# Patient Record
Sex: Female | Born: 1945 | Race: White | Hispanic: No | Marital: Married | Smoking: Former smoker
Health system: Southern US, Community
[De-identification: ages and names within clinical notes are randomized; demographics above are authoritative.]

## PROBLEM LIST (undated history)

## (undated) DIAGNOSIS — B019 Varicella without complication: Secondary | ICD-10-CM

## (undated) DIAGNOSIS — M810 Age-related osteoporosis without current pathological fracture: Secondary | ICD-10-CM

## (undated) DIAGNOSIS — E079 Disorder of thyroid, unspecified: Secondary | ICD-10-CM

## (undated) DIAGNOSIS — M316 Other giant cell arteritis: Secondary | ICD-10-CM

## (undated) DIAGNOSIS — E785 Hyperlipidemia, unspecified: Secondary | ICD-10-CM

## (undated) DIAGNOSIS — K635 Polyp of colon: Secondary | ICD-10-CM

## (undated) HISTORY — PX: ANKLE SURGERY: SHX546

## (undated) HISTORY — PX: TONSILLECTOMY AND ADENOIDECTOMY: SUR1326

## (undated) HISTORY — DX: Other giant cell arteritis: M31.6

## (undated) HISTORY — DX: Polyp of colon: K63.5

## (undated) HISTORY — DX: Varicella without complication: B01.9

## (undated) HISTORY — PX: CATARACT EXTRACTION: SUR2

## (undated) HISTORY — PX: TUBAL LIGATION: SHX77

## (undated) HISTORY — DX: Hyperlipidemia, unspecified: E78.5

## (undated) HISTORY — DX: Disorder of thyroid, unspecified: E07.9

## (undated) HISTORY — DX: Age-related osteoporosis without current pathological fracture: M81.0

---

## 2013-02-11 DIAGNOSIS — H269 Unspecified cataract: Secondary | ICD-10-CM | POA: Insufficient documentation

## 2015-10-22 DIAGNOSIS — R1313 Dysphagia, pharyngeal phase: Secondary | ICD-10-CM | POA: Insufficient documentation

## 2016-01-18 DIAGNOSIS — K635 Polyp of colon: Secondary | ICD-10-CM | POA: Insufficient documentation

## 2016-01-18 DIAGNOSIS — Z83719 Family history of colon polyps, unspecified: Secondary | ICD-10-CM | POA: Insufficient documentation

## 2016-01-18 DIAGNOSIS — Z8371 Family history of colonic polyps: Secondary | ICD-10-CM | POA: Insufficient documentation

## 2016-02-23 DIAGNOSIS — Z8739 Personal history of other diseases of the musculoskeletal system and connective tissue: Secondary | ICD-10-CM | POA: Insufficient documentation

## 2016-12-08 DIAGNOSIS — M4125 Other idiopathic scoliosis, thoracolumbar region: Secondary | ICD-10-CM | POA: Insufficient documentation

## 2018-02-02 DIAGNOSIS — Z636 Dependent relative needing care at home: Secondary | ICD-10-CM | POA: Insufficient documentation

## 2019-05-23 ENCOUNTER — Telehealth: Payer: Self-pay | Admitting: General Practice

## 2019-05-23 NOTE — Telephone Encounter (Signed)
Copied from [place] [id number]. Topic: Appointment Scheduling - Scheduling Inquiry for Clinic >> May 23, 2019 12:53 PM Rutherford Nail, [place] wrote: Reason for CRM: Patient calling and states that she was referred by Stanton Kidney and Pete Glatter to see Dr Alain Marion. States that they had spoken with Dr Alain Marion and he verbally agreed to see patient and her husband as new patients. Please advise. CB#: [phone]  Dr.Plotnikov please advise, Thank you.

## 2019-05-24 NOTE — Telephone Encounter (Signed)
Ok Thx 

## 2019-05-24 NOTE — Telephone Encounter (Signed)
Appointment has been made for 12/15.  °

## 2019-06-08 ENCOUNTER — Encounter: Payer: Self-pay | Admitting: Internal Medicine

## 2019-06-21 ENCOUNTER — Encounter: Payer: Self-pay | Admitting: Internal Medicine

## 2019-06-21 ENCOUNTER — Encounter (INDEPENDENT_AMBULATORY_CARE_PROVIDER_SITE_OTHER): Payer: Self-pay

## 2019-06-21 ENCOUNTER — Other Ambulatory Visit (INDEPENDENT_AMBULATORY_CARE_PROVIDER_SITE_OTHER): Payer: Medicare Other

## 2019-06-21 ENCOUNTER — Ambulatory Visit (INDEPENDENT_AMBULATORY_CARE_PROVIDER_SITE_OTHER): Payer: Medicare Other | Admitting: Internal Medicine

## 2019-06-21 ENCOUNTER — Other Ambulatory Visit: Payer: Self-pay

## 2019-06-21 VITALS — BP 130/68 | HR 77 | Temp 97.9°F | Ht 62.0 in | Wt 100.0 lb

## 2019-06-21 DIAGNOSIS — E785 Hyperlipidemia, unspecified: Secondary | ICD-10-CM

## 2019-06-21 DIAGNOSIS — M316 Other giant cell arteritis: Secondary | ICD-10-CM

## 2019-06-21 DIAGNOSIS — M810 Age-related osteoporosis without current pathological fracture: Secondary | ICD-10-CM | POA: Insufficient documentation

## 2019-06-21 DIAGNOSIS — M47816 Spondylosis without myelopathy or radiculopathy, lumbar region: Secondary | ICD-10-CM

## 2019-06-21 DIAGNOSIS — M4126 Other idiopathic scoliosis, lumbar region: Secondary | ICD-10-CM

## 2019-06-21 DIAGNOSIS — M818 Other osteoporosis without current pathological fracture: Secondary | ICD-10-CM | POA: Diagnosis not present

## 2019-06-21 DIAGNOSIS — M1990 Unspecified osteoarthritis, unspecified site: Secondary | ICD-10-CM | POA: Insufficient documentation

## 2019-06-21 DIAGNOSIS — M419 Scoliosis, unspecified: Secondary | ICD-10-CM | POA: Insufficient documentation

## 2019-06-21 DIAGNOSIS — E559 Vitamin D deficiency, unspecified: Secondary | ICD-10-CM | POA: Diagnosis not present

## 2019-06-21 LAB — LIPID PANEL
Cholesterol: 138 mg/dL (ref 0–200)
HDL: 50.7 mg/dL (ref >39.00)
LDL Cholesterol: 74 mg/dL (ref 0–99)
NonHDL: 87.62
Total CHOL/HDL Ratio: 3
Triglycerides: 68 mg/dL (ref 0.0–149.0)
VLDL: 13.6 mg/dL (ref 0.0–40.0)

## 2019-06-21 LAB — CBC WITH DIFFERENTIAL/PLATELET
Basophils Absolute: 0 10*3/uL (ref 0.0–0.1)
Basophils Relative: 0.5 % (ref 0.0–3.0)
Eosinophils Absolute: 0.1 10*3/uL (ref 0.0–0.7)
Eosinophils Relative: 1.5 % (ref 0.0–5.0)
HCT: 35.4 % — ABNORMAL LOW (ref 36.0–46.0)
Hemoglobin: 12 g/dL (ref 12.0–15.0)
Lymphocytes Relative: 18.5 % (ref 12.0–46.0)
Lymphs Abs: 1.4 10*3/uL (ref 0.7–4.0)
MCHC: 33.8 g/dL (ref 30.0–36.0)
MCV: 89.1 fl (ref 78.0–100.0)
Monocytes Absolute: 0.8 10*3/uL (ref 0.1–1.0)
Monocytes Relative: 10.1 % (ref 3.0–12.0)
Neutro Abs: 5.2 10*3/uL (ref 1.4–7.7)
Neutrophils Relative %: 69.4 % (ref 43.0–77.0)
Platelets: 260 10*3/uL (ref 150.0–400.0)
RBC: 3.98 Mil/uL (ref 3.87–5.11)
RDW: 13.2 % (ref 11.5–15.5)
WBC: 7.5 10*3/uL (ref 4.0–10.5)

## 2019-06-21 LAB — HEPATIC FUNCTION PANEL
ALT: 20 U/L (ref 0–35)
AST: 27 U/L (ref 0–37)
Albumin: 4 g/dL (ref 3.5–5.2)
Alkaline Phosphatase: 57 U/L (ref 39–117)
Bilirubin, Direct: 0.1 mg/dL (ref 0.0–0.3)
Total Bilirubin: 0.3 mg/dL (ref 0.2–1.2)
Total Protein: 7.1 g/dL (ref 6.0–8.3)

## 2019-06-21 LAB — BASIC METABOLIC PANEL
BUN: 15 mg/dL (ref 6–23)
CO2: 30 mEq/L (ref 19–32)
Calcium: 9.3 mg/dL (ref 8.4–10.5)
Chloride: 101 mEq/L (ref 96–112)
Creatinine, Ser: 0.64 mg/dL (ref 0.40–1.20)
GFR: 90.69 mL/min (ref >60.00)
Glucose, Bld: 118 mg/dL — ABNORMAL HIGH (ref 70–99)
Potassium: 3.7 mEq/L (ref 3.5–5.1)
Sodium: 138 mEq/L (ref 135–145)

## 2019-06-21 LAB — C-REACTIVE PROTEIN: CRP: 3.6 mg/dL (ref 0.5–20.0)

## 2019-06-21 LAB — TSH: TSH: 2.22 u[IU]/mL (ref 0.35–4.50)

## 2019-06-21 LAB — SEDIMENTATION RATE: Sed Rate: 32 mm/hr — ABNORMAL HIGH (ref 0–30)

## 2019-06-21 MED ORDER — SHINGRIX 50 MCG/0.5ML IM SUSR: 0.5000 mL | Freq: Once | INTRAMUSCULAR | 1 refills | Status: AC

## 2019-06-21 NOTE — Assessment & Plan Note (Signed)
On Crestor 

## 2019-06-21 NOTE — Progress Notes (Signed)
Subjective:  Patient ID: Julie Richard, female    DOB: 1945/12/22  Age: 73 y.o. MRN: [id number]  CC: No chief complaint on file.   HPI Julie Richard presents for a new pt visit H/o PMR 2017 C/o osteoporosis - on Prolia - Pt was on Actonel>10 y, then reclast x 5 years Started Prolia 2020, due another shot 3/21 On Vit D 400 iu 3/wk From [facility]; 2 sons here   No outpatient medications prior to visit.   No facility-administered medications prior to visit.    ROS: Review of Systems  Constitutional: Negative for activity change, appetite change, chills, fatigue and unexpected weight change.  HENT: Negative for congestion, mouth sores and sinus pressure.   Eyes: Negative for visual disturbance.  Respiratory: Negative for cough and chest tightness.   Gastrointestinal: Negative for abdominal pain and nausea.  Genitourinary: Negative for difficulty urinating, frequency and vaginal pain.  Musculoskeletal: Negative for back pain and gait problem.  Skin: Negative for pallor and rash.  Neurological: Negative for dizziness, tremors, weakness, numbness and headaches.  Psychiatric/Behavioral: Negative for confusion, sleep disturbance and suicidal ideas.    Objective:  BP 130/68 (BP Location: Left Arm, Patient Position: Sitting, Cuff Size: Normal)   Pulse 77   Temp 97.9 F (36.6 C) (Oral)   Ht 5\' 2"  (1.575 m)   Wt 100 lb (45.4 kg)   SpO2 98%   BMI 18.29 kg/m   BP Readings from Last 3 Encounters:  06/21/19 130/68    Wt Readings from Last 3 Encounters:  06/21/19 100 lb (45.4 kg)    Physical Exam Constitutional:      General: She is not in acute distress.    Appearance: She is well-developed.  HENT:     Head: Normocephalic.     Right Ear: External ear normal.     Left Ear: External ear normal.     Nose: Nose normal.  Eyes:     General:        Right eye: No discharge.        Left eye: No discharge.     Conjunctiva/sclera: Conjunctivae normal.     Pupils: Pupils are equal,  round, and reactive to light.  Neck:     Thyroid: No thyromegaly.     Vascular: No JVD.     Trachea: No tracheal deviation.  Cardiovascular:     Rate and Rhythm: Normal rate and regular rhythm.     Heart sounds: Normal heart sounds.  Pulmonary:     Effort: No respiratory distress.     Breath sounds: No stridor. No wheezing.  Abdominal:     General: Bowel sounds are normal. There is no distension.     Palpations: Abdomen is soft. There is no mass.     Tenderness: There is no abdominal tenderness. There is no guarding or rebound.  Musculoskeletal:        General: No tenderness.     Cervical back: Normal range of motion and neck supple.  Lymphadenopathy:     Cervical: No cervical adenopathy.  Skin:    Findings: No erythema or rash.  Neurological:     Cranial Nerves: No cranial nerve deficit.     Motor: No abnormal muscle tone.     Coordination: Coordination normal.     Deep Tendon Reflexes: Reflexes normal.  Psychiatric:        Behavior: Behavior normal.        Thought Content: Thought content normal.  Judgment: Judgment normal.   thin  No results found for: WBC, HGB, HCT, PLT, GLUCOSE, CHOL, TRIG, HDL, LDLDIRECT, LDLCALC, ALT, AST, NA, K, CL, CREATININE, BUN, CO2, TSH, PSA, INR, GLUF, HGBA1C, MICROALBUR  Patient was never admitted.  Assessment & Plan:   There are no diagnoses linked to this encounter.   No orders of the defined types were placed in this encounter.    Follow-up: No follow-ups on file.  Sonda Primes, MD

## 2019-06-21 NOTE — Assessment & Plan Note (Signed)
Meloxicam prn 

## 2019-06-21 NOTE — Assessment & Plan Note (Signed)
2017 R>L On steroids x 1 year

## 2019-06-21 NOTE — Assessment & Plan Note (Signed)
On Prolia 2020, due another shot 3/21 On Vit D 400 iu 3/wk labs

## 2019-06-22 LAB — VITAMIN D 25 HYDROXY (VIT D DEFICIENCY, FRACTURES): VITD: 72.05 ng/mL (ref 30.00–100.00)

## 2019-07-31 ENCOUNTER — Ambulatory Visit: Payer: Medicare Other | Attending: Internal Medicine

## 2019-07-31 DIAGNOSIS — Z23 Encounter for immunization: Secondary | ICD-10-CM | POA: Insufficient documentation

## 2019-07-31 NOTE — Progress Notes (Signed)
   Covid-19 Vaccination Clinic  Name:  Julie Richard    MRN: [id number] DOB: December 27, 1945  07/31/2019  Julie Richard was observed post Covid-19 immunization for 15 minutes without incidence. She was provided with Vaccine Information Sheet and instruction to access the V-Safe system.   Julie Richard was instructed to call 911 with any severe reactions post vaccine: Marland Kitchen Difficulty breathing  . Swelling of your face and throat  . A fast heartbeat  . A bad rash all over your body  . Dizziness and weakness    Immunizations Administered    Name Date Dose VIS Date Route   Pfizer COVID-19 Vaccine 07/31/2019 11:21 AM 0.3 mL 06/17/2019 Intramuscular   Manufacturer: [employer], [employer]   Lot: [id number]   NDC: [id number]

## 2019-08-02 ENCOUNTER — Telehealth: Payer: Self-pay | Admitting: Internal Medicine

## 2019-08-02 NOTE — Telephone Encounter (Signed)
Patient called stating that she and her husband both had their full 2 immunizations for COVID-19.  She is concerned because they both took regular strength Tylenol pre ans post the immunization.  She has read an article in [employer] stating that this could cut the antibody response and make the vaccine ineffective.w Patient is requesting call back for advice.

## 2019-08-03 NOTE — Telephone Encounter (Signed)
I would worry about it.  They should have a good response to the vaccine.  There is no official recommendation not to take Tylenol prior to vaccination. Thanks, 

## 2019-08-03 NOTE — Telephone Encounter (Signed)
Please advise 

## 2019-08-04 NOTE — Telephone Encounter (Signed)
Pt.notified

## 2019-08-20 ENCOUNTER — Ambulatory Visit: Payer: Medicare Other

## 2019-10-05 ENCOUNTER — Ambulatory Visit (INDEPENDENT_AMBULATORY_CARE_PROVIDER_SITE_OTHER): Payer: Medicare Other | Admitting: *Deleted

## 2019-10-05 ENCOUNTER — Other Ambulatory Visit: Payer: Self-pay

## 2019-10-05 DIAGNOSIS — M810 Age-related osteoporosis without current pathological fracture: Secondary | ICD-10-CM

## 2019-10-05 MED ORDER — DENOSUMAB 60 MG/ML ~~LOC~~ SOSY
60.0000 mg | PREFILLED_SYRINGE | Freq: Once | SUBCUTANEOUS | Status: AC
  Administered 2019-10-05: 60 mg via SUBCUTANEOUS

## 2019-10-05 NOTE — Progress Notes (Addendum)
Pls cosign for prolia inj../lmb  Medical screening examination/treatment/procedure(s) were performed by non-physician practitioner and as supervising physician I was immediately available for consultation/collaboration.  I agree with above. Aleksei Plotnikov, MD 

## 2019-10-07 ENCOUNTER — Ambulatory Visit: Payer: Medicare Other

## 2019-10-18 ENCOUNTER — Encounter: Payer: Self-pay | Admitting: Internal Medicine

## 2019-10-18 ENCOUNTER — Ambulatory Visit (INDEPENDENT_AMBULATORY_CARE_PROVIDER_SITE_OTHER): Payer: Medicare Other | Admitting: Internal Medicine

## 2019-10-18 ENCOUNTER — Other Ambulatory Visit: Payer: Self-pay

## 2019-10-18 DIAGNOSIS — M818 Other osteoporosis without current pathological fracture: Secondary | ICD-10-CM | POA: Diagnosis not present

## 2019-10-18 DIAGNOSIS — M47816 Spondylosis without myelopathy or radiculopathy, lumbar region: Secondary | ICD-10-CM

## 2019-10-18 DIAGNOSIS — E785 Hyperlipidemia, unspecified: Secondary | ICD-10-CM | POA: Diagnosis not present

## 2019-10-18 DIAGNOSIS — M316 Other giant cell arteritis: Secondary | ICD-10-CM

## 2019-10-18 MED ORDER — CHOLECALCIFEROL 50 MCG (2000 UT) PO TABS: ORAL_TABLET | ORAL | 3 refills | Status: AC

## 2019-10-18 NOTE — Assessment & Plan Note (Signed)
-   Crestor 

## 2019-10-18 NOTE — Patient Instructions (Signed)

## 2019-10-18 NOTE — Assessment & Plan Note (Signed)
Meloxicam prn 

## 2019-10-18 NOTE — Assessment & Plan Note (Signed)
Vit D3 1000 iu 3/week - high levels in the past

## 2019-10-18 NOTE — Progress Notes (Signed)
Subjective:  Patient ID: Julie Richard, female    DOB: [birth year]  Age: 74 y.o. MRN: [id number]  CC: No chief complaint on file.   HPI Julie Richard presents for LBP, osteoporosis, dyslipidemia f/u  Outpatient Medications Prior to Visit  Medication Sig Dispense Refill  . baclofen (LIORESAL) 10 MG tablet Take by mouth.    Marland Kitchen CALCIUM PO Take by mouth.    . Cholecalciferol 1.25 MG (50000 UT) TABS Take by mouth.    . Cholecalciferol 50 MCG (2000 UT) TABS Take by mouth.    . denosumab (PROLIA) 60 MG/ML SOSY injection Inject 60 mg into the skin every 6 (six) months.    . meloxicam (MOBIC) 7.5 MG tablet TAKE 1 TABLET BY MOUTH AS NEEDED FOR PAIN    . Multiple Vitamin (MULTI-VITAMIN) tablet Take by mouth.    . rosuvastatin (CRESTOR) 10 MG tablet TAKE 1 TABLET(10 MG) BY MOUTH DAILY    . ascorbic acid (VITAMIN C) 500 MG tablet Take by mouth.     No facility-administered medications prior to visit.    ROS: Review of Systems  Constitutional: Negative for activity change, appetite change, chills, fatigue and unexpected weight change.  HENT: Negative for congestion, mouth sores and sinus pressure.   Eyes: Negative for visual disturbance.  Respiratory: Negative for cough and chest tightness.   Gastrointestinal: Negative for abdominal pain and nausea.  Genitourinary: Negative for difficulty urinating, frequency and vaginal pain.  Musculoskeletal: Positive for arthralgias and back pain. Negative for gait problem.  Skin: Negative for pallor and rash.  Neurological: Negative for dizziness, tremors, weakness, numbness and headaches.  Psychiatric/Behavioral: Negative for confusion and sleep disturbance.    Objective:  BP 118/60 (BP Location: Left Arm, Patient Position: Sitting, Cuff Size: Normal)   Pulse 68   Temp 97.8 F (36.6 C) (Oral)   Ht 5\' 2"  (1.575 m)   Wt 100 lb (45.4 kg)   SpO2 99%   BMI 18.29 kg/m   BP Readings from Last 3 Encounters:  10/18/19 118/60  06/21/19 130/68    Wt  Readings from Last 3 Encounters:  10/18/19 100 lb (45.4 kg)  06/21/19 100 lb (45.4 kg)    Physical Exam Constitutional:      General: She is not in acute distress.    Appearance: She is well-developed.  HENT:     Head: Normocephalic.     Right Ear: External ear normal.     Left Ear: External ear normal.     Nose: Nose normal.  Eyes:     General:        Right eye: No discharge.        Left eye: No discharge.     Conjunctiva/sclera: Conjunctivae normal.     Pupils: Pupils are equal, round, and reactive to light.  Neck:     Thyroid: No thyromegaly.     Vascular: No JVD.     Trachea: No tracheal deviation.  Cardiovascular:     Rate and Rhythm: Normal rate and regular rhythm.     Heart sounds: Normal heart sounds.  Pulmonary:     Effort: No respiratory distress.     Breath sounds: No stridor. No wheezing.  Abdominal:     General: Bowel sounds are normal. There is no distension.     Palpations: Abdomen is soft. There is no mass.     Tenderness: There is no abdominal tenderness. There is no guarding or rebound.  Musculoskeletal:        General: No  tenderness.     Cervical back: Normal range of motion and neck supple.  Lymphadenopathy:     Cervical: No cervical adenopathy.  Skin:    Findings: No erythema or rash.  Neurological:     Mental Status: She is oriented to person, place, and time.     Cranial Nerves: No cranial nerve deficit.     Motor: No abnormal muscle tone.     Coordination: Coordination normal.     Deep Tendon Reflexes: Reflexes normal.  Psychiatric:        Behavior: Behavior normal.        Thought Content: Thought content normal.        Judgment: Judgment normal.     Lab Results  Component Value Date   WBC 7.5 06/21/2019   HGB 12.0 06/21/2019   HCT 35.4 (L) 06/21/2019   PLT 260.0 06/21/2019   GLUCOSE 118 (H) 06/21/2019   CHOL 138 06/21/2019   TRIG 68.0 06/21/2019   HDL 50.70 06/21/2019   LDLCALC 74 06/21/2019   ALT 20 06/21/2019   AST 27  06/21/2019   NA 138 06/21/2019   K 3.7 06/21/2019   CL 101 06/21/2019   CREATININE 0.64 06/21/2019   BUN 15 06/21/2019   CO2 30 06/21/2019   TSH 2.22 06/21/2019    Patient was never admitted.  Assessment & Plan:   There are no diagnoses linked to this encounter.   No orders of the defined types were placed in this encounter.    Follow-up: No follow-ups on file.  Walker Kehr, MD

## 2019-10-21 DIAGNOSIS — H40002 Preglaucoma, unspecified, left eye: Secondary | ICD-10-CM | POA: Insufficient documentation

## 2019-10-21 DIAGNOSIS — H35363 Drusen (degenerative) of macula, bilateral: Secondary | ICD-10-CM | POA: Insufficient documentation

## 2019-11-08 ENCOUNTER — Encounter: Payer: Self-pay | Admitting: Internal Medicine

## 2019-11-29 ENCOUNTER — Ambulatory Visit (INDEPENDENT_AMBULATORY_CARE_PROVIDER_SITE_OTHER)
Admission: RE | Admit: 2019-11-29 | Discharge: 2019-11-29 | Disposition: A | Discharge status: Home / Self Care | Payer: Self-pay | Source: Ambulatory Visit | Attending: Internal Medicine | Admitting: Internal Medicine

## 2019-11-29 ENCOUNTER — Other Ambulatory Visit: Payer: Self-pay

## 2019-11-29 DIAGNOSIS — E785 Hyperlipidemia, unspecified: Secondary | ICD-10-CM

## 2019-12-07 ENCOUNTER — Other Ambulatory Visit: Payer: Self-pay | Admitting: Internal Medicine

## 2019-12-07 MED ORDER — ASPIRIN EC 81 MG PO TBEC: 81.0000 mg | DELAYED_RELEASE_TABLET | Freq: Every day | ORAL | 3 refills | Status: AC

## 2019-12-12 ENCOUNTER — Telehealth: Payer: Self-pay

## 2019-12-12 DIAGNOSIS — Z1231 Encounter for screening mammogram for malignant neoplasm of breast: Secondary | ICD-10-CM

## 2019-12-12 NOTE — Telephone Encounter (Signed)
Patient calling and would like to get a mammogram scheduled; no orders at this time. Please advise.

## 2019-12-13 NOTE — Telephone Encounter (Signed)
Okay.  Done. 

## 2019-12-27 ENCOUNTER — Other Ambulatory Visit: Payer: Self-pay | Admitting: Internal Medicine

## 2019-12-27 DIAGNOSIS — Z1231 Encounter for screening mammogram for malignant neoplasm of breast: Secondary | ICD-10-CM

## 2020-01-21 ENCOUNTER — Other Ambulatory Visit: Payer: Self-pay | Admitting: Internal Medicine

## 2020-01-23 ENCOUNTER — Other Ambulatory Visit: Payer: Self-pay

## 2020-01-23 ENCOUNTER — Ambulatory Visit
Admission: RE | Admit: 2020-01-23 | Discharge: 2020-01-23 | Disposition: A | Discharge status: Home / Self Care | Payer: Medicare Other | Source: Ambulatory Visit | Attending: Internal Medicine | Admitting: Internal Medicine

## 2020-02-23 ENCOUNTER — Other Ambulatory Visit: Payer: Self-pay

## 2020-02-23 ENCOUNTER — Encounter: Payer: Self-pay | Admitting: Internal Medicine

## 2020-02-23 ENCOUNTER — Ambulatory Visit (INDEPENDENT_AMBULATORY_CARE_PROVIDER_SITE_OTHER): Payer: Medicare Other | Admitting: Internal Medicine

## 2020-02-23 DIAGNOSIS — Q782 Osteopetrosis: Secondary | ICD-10-CM | POA: Insufficient documentation

## 2020-02-23 DIAGNOSIS — M47816 Spondylosis without myelopathy or radiculopathy, lumbar region: Secondary | ICD-10-CM

## 2020-02-23 DIAGNOSIS — I2510 Atherosclerotic heart disease of native coronary artery without angina pectoris: Secondary | ICD-10-CM

## 2020-02-23 DIAGNOSIS — F418 Other specified anxiety disorders: Secondary | ICD-10-CM | POA: Diagnosis not present

## 2020-02-23 DIAGNOSIS — I2583 Coronary atherosclerosis due to lipid rich plaque: Secondary | ICD-10-CM

## 2020-02-23 MED ORDER — ESCITALOPRAM OXALATE 5 MG PO TABS
5.0000 mg | ORAL_TABLET | Freq: Every day | ORAL | 5 refills | Status: DC
Start: 2020-02-23 — End: 2020-08-01

## 2020-02-23 NOTE — Progress Notes (Signed)
Subjective:  Patient ID: Julie Richard, female    DOB: [birth year]  Age: 74 y.o. MRN: [id number]  CC: No chief complaint on file.   HPI Julie Richard presents for anxiety x months Husband w/Alzheimer's - worse Not threatened physically. No hitting. F/u CAD, OA  Outpatient Medications Prior to Visit  Medication Sig Dispense Refill  . aspirin EC 81 MG tablet Take 1 tablet (81 mg total) by mouth daily. 100 tablet 3  . baclofen (LIORESAL) 10 MG tablet Take by mouth.    Marland Kitchen CALCIUM PO Take by mouth.    . Cholecalciferol 50 MCG (2000 UT) TABS 1000 iu 3/week - high levels in the past 100 tablet 3  . denosumab (PROLIA) 60 MG/ML SOSY injection Inject 60 mg into the skin every 6 (six) months.    . meloxicam (MOBIC) 7.5 MG tablet TAKE 1 TABLET BY MOUTH AS NEEDED FOR PAIN    . Multiple Vitamin (MULTI-VITAMIN) tablet Take by mouth.    . rosuvastatin (CRESTOR) 10 MG tablet TAKE 1 TABLET(10 MG) BY MOUTH DAILY 90 tablet 3   No facility-administered medications prior to visit.    ROS: Review of Systems  Constitutional: Negative for activity change, appetite change, chills, fatigue and unexpected weight change.  HENT: Negative for congestion, mouth sores and sinus pressure.   Eyes: Negative for visual disturbance.  Respiratory: Negative for cough and chest tightness.   Gastrointestinal: Negative for abdominal pain and nausea.  Genitourinary: Negative for difficulty urinating, frequency and vaginal pain.  Musculoskeletal: Negative for back pain and gait problem.  Skin: Negative for pallor and rash.  Neurological: Negative for dizziness, tremors, weakness, numbness and headaches.  Psychiatric/Behavioral: Positive for dysphoric mood and sleep disturbance. Negative for confusion, decreased concentration and suicidal ideas. The patient is nervous/anxious.     Objective:  BP 116/72   Pulse 69   Temp 98.3 F (36.8 C) (Oral)   Ht 5\' 1"  (1.549 m)   Wt 100 lb (45.4 kg)   SpO2 98%   BMI  18.89 kg/m   BP Readings from Last 3 Encounters:  02/23/20 116/72  10/18/19 118/60  06/21/19 130/68    Wt Readings from Last 3 Encounters:  02/23/20 100 lb (45.4 kg)  10/18/19 100 lb (45.4 kg)  06/21/19 100 lb (45.4 kg)    Physical Exam Constitutional:      General: She is not in acute distress.    Appearance: She is well-developed.  HENT:     Head: Normocephalic.     Right Ear: External ear normal.     Left Ear: External ear normal.     Nose: Nose normal.  Eyes:     General:        Right eye: No discharge.        Left eye: No discharge.     Conjunctiva/sclera: Conjunctivae normal.     Pupils: Pupils are equal, round, and reactive to light.  Neck:     Thyroid: No thyromegaly.     Vascular: No JVD.     Trachea: No tracheal deviation.  Cardiovascular:     Rate and Rhythm: Normal rate and regular rhythm.     Heart sounds: Normal heart sounds.  Pulmonary:     Effort: No respiratory distress.     Breath sounds: No stridor. No wheezing.  Abdominal:     General: Bowel sounds are normal. There is no distension.     Palpations: Abdomen is soft. There is no mass.  Tenderness: There is no abdominal tenderness. There is no guarding or rebound.  Musculoskeletal:        General: No tenderness.     Cervical back: Normal range of motion and neck supple.  Lymphadenopathy:     Cervical: No cervical adenopathy.  Skin:    Findings: No erythema or rash.  Neurological:     Mental Status: She is oriented to person, place, and time.     Cranial Nerves: No cranial nerve deficit.     Motor: No abnormal muscle tone.     Coordination: Coordination normal.     Deep Tendon Reflexes: Reflexes normal.  Psychiatric:        Behavior: Behavior normal.        Thought Content: Thought content normal.        Judgment: Judgment normal.     Lab Results  Component Value Date   WBC 7.5 06/21/2019   HGB 12.0 06/21/2019   HCT 35.4 (L) 06/21/2019   PLT 260.0 06/21/2019   GLUCOSE 118 (H)  06/21/2019   CHOL 138 06/21/2019   TRIG 68.0 06/21/2019   HDL 50.70 06/21/2019   LDLCALC 74 06/21/2019   ALT 20 06/21/2019   AST 27 06/21/2019   NA 138 06/21/2019   K 3.7 06/21/2019   CL 101 06/21/2019   CREATININE 0.64 06/21/2019   BUN 15 06/21/2019   CO2 30 06/21/2019   TSH 2.22 06/21/2019    MM DIGITAL SCREENING BILATERAL  Result Date: 01/24/2020 CLINICAL DATA:  Screening. EXAM: DIGITAL SCREENING BILATERAL MAMMOGRAM WITH CAD COMPARISON:  Previous exam(s). ACR Breast Density Category b: There are scattered areas of fibroglandular density. FINDINGS: There are no findings suspicious for malignancy. Images were processed with CAD. IMPRESSION: No mammographic evidence of malignancy. A result letter of this screening mammogram will be mailed directly to the patient. RECOMMENDATION: Screening mammogram in one year. (Code:SM-B-01Y) BI-RADS CATEGORY  1: Negative. Electronically Signed   By: Elberta Fortis M.D.   On: 01/24/2020 16:46    Assessment & Plan:     Follow-up: No follow-ups on file.  Sonda Primes, MD

## 2020-02-23 NOTE — Assessment & Plan Note (Signed)
Low dose Meloxicam prn 

## 2020-02-23 NOTE — Assessment & Plan Note (Signed)
Start Lexapro - low dose 

## 2020-02-23 NOTE — Assessment & Plan Note (Signed)
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

## 2020-02-23 NOTE — Assessment & Plan Note (Addendum)
5/21 CT IMPRESSION: Coronary calcium score of 166 . This was percentile 40 for age and sex matched control. Kardie Tobb,DO Electronically Signed   By: Thomasene Ripple MD   On: 11/29/2019 17:10  ASA, Crestor EKG

## 2020-03-06 ENCOUNTER — Telehealth (INDEPENDENT_AMBULATORY_CARE_PROVIDER_SITE_OTHER): Payer: Medicare Other

## 2020-03-06 DIAGNOSIS — F418 Other specified anxiety disorders: Secondary | ICD-10-CM | POA: Diagnosis not present

## 2020-03-06 NOTE — Telephone Encounter (Signed)
Added 02/23/20 EKG

## 2020-04-03 ENCOUNTER — Ambulatory Visit: Payer: Medicare Other | Attending: Internal Medicine

## 2020-04-03 DIAGNOSIS — Z23 Encounter for immunization: Secondary | ICD-10-CM

## 2020-04-03 NOTE — Progress Notes (Signed)
   Covid-19 Vaccination Clinic  Name:  Ellina Sivertsen    MRN: [id number] DOB: 1945/12/22  04/03/2020  Ms. Ates was observed post Covid-19 immunization for 15 minutes without incident. She was provided with Vaccine Information Sheet and instruction to access the V-Safe system.   Ms. Cuoco was instructed to call 911 with any severe reactions post vaccine: Marland Kitchen Difficulty breathing  . Swelling of face and throat  . A fast heartbeat  . A bad rash all over body  . Dizziness and weakness

## 2020-04-05 ENCOUNTER — Telehealth: Payer: Self-pay | Admitting: Internal Medicine

## 2020-04-05 NOTE — Telephone Encounter (Signed)
prolia due  Check benefits and schedule Would like to have it done on October 6th when she sees Dr. Posey Rea if possible,  Flu shot and booster, how close together can you receive it?

## 2020-04-06 NOTE — Telephone Encounter (Signed)
Verified that pt is good to have prolia per [facility], pt aware and also advised pt that there is no time frame she needs to wait to receive either vaccine

## 2020-04-11 ENCOUNTER — Other Ambulatory Visit: Payer: Self-pay

## 2020-04-11 ENCOUNTER — Encounter: Payer: Self-pay | Admitting: Internal Medicine

## 2020-04-11 ENCOUNTER — Ambulatory Visit (INDEPENDENT_AMBULATORY_CARE_PROVIDER_SITE_OTHER): Payer: Medicare Other | Admitting: Internal Medicine

## 2020-04-11 VITALS — BP 110/62 | HR 66 | Temp 98.6°F | Ht 61.0 in | Wt 100.0 lb

## 2020-04-11 DIAGNOSIS — T452X1S Poisoning by vitamins, accidental (unintentional), sequela: Secondary | ICD-10-CM | POA: Diagnosis not present

## 2020-04-11 DIAGNOSIS — M818 Other osteoporosis without current pathological fracture: Secondary | ICD-10-CM

## 2020-04-11 DIAGNOSIS — I2510 Atherosclerotic heart disease of native coronary artery without angina pectoris: Secondary | ICD-10-CM | POA: Diagnosis not present

## 2020-04-11 DIAGNOSIS — E559 Vitamin D deficiency, unspecified: Secondary | ICD-10-CM | POA: Diagnosis not present

## 2020-04-11 DIAGNOSIS — T452X1A Poisoning by vitamins, accidental (unintentional), initial encounter: Secondary | ICD-10-CM | POA: Insufficient documentation

## 2020-04-11 DIAGNOSIS — F418 Other specified anxiety disorders: Secondary | ICD-10-CM

## 2020-04-11 DIAGNOSIS — I2583 Coronary atherosclerosis due to lipid rich plaque: Secondary | ICD-10-CM | POA: Diagnosis not present

## 2020-04-11 LAB — COMPREHENSIVE METABOLIC PANEL
ALT: 16 U/L (ref 0–35)
AST: 21 U/L (ref 0–37)
Albumin: 4.1 g/dL (ref 3.5–5.2)
Alkaline Phosphatase: 53 U/L (ref 39–117)
BUN: 20 mg/dL (ref 6–23)
CO2: 32 mEq/L (ref 19–32)
Calcium: 9.8 mg/dL (ref 8.4–10.5)
Chloride: 102 mEq/L (ref 96–112)
Creatinine, Ser: 0.67 mg/dL (ref 0.40–1.20)
GFR: 86.04 mL/min (ref >60.00)
Glucose, Bld: 87 mg/dL (ref 70–99)
Potassium: 3.9 mEq/L (ref 3.5–5.1)
Sodium: 139 mEq/L (ref 135–145)
Total Bilirubin: 0.5 mg/dL (ref 0.2–1.2)
Total Protein: 7.1 g/dL (ref 6.0–8.3)

## 2020-04-11 LAB — VITAMIN D 25 HYDROXY (VIT D DEFICIENCY, FRACTURES): VITD: 76.05 ng/mL (ref 30.00–100.00)

## 2020-04-11 MED ORDER — DENOSUMAB 60 MG/ML ~~LOC~~ SOSY
60.0000 mg | PREFILLED_SYRINGE | Freq: Once | SUBCUTANEOUS | Status: AC
  Administered 2020-04-11: 60 mg via SUBCUTANEOUS

## 2020-04-11 NOTE — Assessment & Plan Note (Signed)
ASA Crestor 

## 2020-04-11 NOTE — Progress Notes (Signed)
Subjective:  Patient ID: Julie Richard, female    DOB: 02-03-1946  Age: 74 y.o. MRN: [id number]  CC: Follow-up   HPI Julie Richard presents for R foot fx, osteoporosis, dyslipidemia C/o stress w/husband  Outpatient Medications Prior to Visit  Medication Sig Dispense Refill   aspirin EC 81 MG tablet Take 1 tablet (81 mg total) by mouth daily. 100 tablet 3   baclofen (LIORESAL) 10 MG tablet Take by mouth.     CALCIUM PO Take by mouth.     Cholecalciferol 50 MCG (2000 UT) TABS 1000 iu 3/week - high levels in the past 100 tablet 3   denosumab (PROLIA) 60 MG/ML SOSY injection Inject 60 mg into the skin every 6 (six) months.     escitalopram (LEXAPRO) 5 MG tablet Take 1 tablet (5 mg total) by mouth daily. 30 tablet 5   meloxicam (MOBIC) 7.5 MG tablet TAKE 1 TABLET BY MOUTH AS NEEDED FOR PAIN     Multiple Vitamin (MULTI-VITAMIN) tablet Take by mouth.     rosuvastatin (CRESTOR) 10 MG tablet TAKE 1 TABLET(10 MG) BY MOUTH DAILY 90 tablet 3   No facility-administered medications prior to visit.    ROS: Review of Systems  Constitutional: Negative for activity change, appetite change, chills, fatigue and unexpected weight change.  HENT: Negative for congestion, mouth sores and sinus pressure.   Eyes: Negative for visual disturbance.  Respiratory: Negative for cough and chest tightness.   Gastrointestinal: Negative for abdominal pain and nausea.  Genitourinary: Negative for difficulty urinating, frequency and vaginal pain.  Musculoskeletal: Positive for arthralgias and gait problem. Negative for back pain.  Skin: Negative for pallor and rash.  Neurological: Negative for dizziness, tremors, weakness, numbness and headaches.  Psychiatric/Behavioral: Negative for confusion and sleep disturbance. The patient is nervous/anxious.     Objective:  BP 110/62 (BP Location: Left Arm, Patient Position: Sitting, Cuff Size: Normal)    Pulse 66    Temp 98.6 F (37 C) (Oral)    Ht 5\' 1"   (1.549 m)    Wt 100 lb (45.4 kg)    SpO2 99%    BMI 18.89 kg/m   BP Readings from Last 3 Encounters:  04/11/20 110/62  02/23/20 116/72  10/18/19 118/60    Wt Readings from Last 3 Encounters:  04/11/20 100 lb (45.4 kg)  02/23/20 100 lb (45.4 kg)  10/18/19 100 lb (45.4 kg)    Physical Exam Constitutional:      General: She is not in acute distress.    Appearance: She is well-developed.  HENT:     Head: Normocephalic.     Right Ear: External ear normal.     Left Ear: External ear normal.     Nose: Nose normal.  Eyes:     General:        Right eye: No discharge.        Left eye: No discharge.     Conjunctiva/sclera: Conjunctivae normal.     Pupils: Pupils are equal, round, and reactive to light.  Neck:     Thyroid: No thyromegaly.     Vascular: No JVD.     Trachea: No tracheal deviation.  Cardiovascular:     Rate and Rhythm: Normal rate and regular rhythm.     Heart sounds: Normal heart sounds.  Pulmonary:     Effort: No respiratory distress.     Breath sounds: No stridor. No wheezing.  Abdominal:     General: Bowel sounds are normal. There is  no distension.     Palpations: Abdomen is soft. There is no mass.     Tenderness: There is no abdominal tenderness. There is no guarding or rebound.  Musculoskeletal:        General: No tenderness.     Cervical back: Normal range of motion and neck supple.  Lymphadenopathy:     Cervical: No cervical adenopathy.  Skin:    Findings: No erythema or rash.  Neurological:     Cranial Nerves: No cranial nerve deficit.     Motor: No abnormal muscle tone.     Coordination: Coordination normal.     Deep Tendon Reflexes: Reflexes normal.  Psychiatric:        Behavior: Behavior normal.        Thought Content: Thought content normal.        Judgment: Judgment normal.   R foot in a boot  Lab Results  Component Value Date   WBC 7.5 06/21/2019   HGB 12.0 06/21/2019   HCT 35.4 (L) 06/21/2019   PLT 260.0 06/21/2019   GLUCOSE 118  (H) 06/21/2019   CHOL 138 06/21/2019   TRIG 68.0 06/21/2019   HDL 50.70 06/21/2019   LDLCALC 74 06/21/2019   ALT 20 06/21/2019   AST 27 06/21/2019   NA 138 06/21/2019   K 3.7 06/21/2019   CL 101 06/21/2019   CREATININE 0.64 06/21/2019   BUN 15 06/21/2019   CO2 30 06/21/2019   TSH 2.22 06/21/2019    MM DIGITAL SCREENING BILATERAL  Result Date: 01/24/2020 CLINICAL DATA:  Screening. EXAM: DIGITAL SCREENING BILATERAL MAMMOGRAM WITH CAD COMPARISON:  Previous exam(s). ACR Breast Density Category b: There are scattered areas of fibroglandular density. FINDINGS: There are no findings suspicious for malignancy. Images were processed with CAD. IMPRESSION: No mammographic evidence of malignancy. A result letter of this screening mammogram will be mailed directly to the patient. RECOMMENDATION: Screening mammogram in one year. (Code:SM-B-01Y) BI-RADS CATEGORY  1: Negative. Electronically Signed   By: Elberta Fortis M.D.   On: 01/24/2020 16:46    Assessment & Plan:   There are no diagnoses linked to this encounter.   No orders of the defined types were placed in this encounter.    Follow-up: No follow-ups on file.  Sonda Primes, MD

## 2020-04-11 NOTE — Assessment & Plan Note (Signed)
Lexapro 

## 2020-04-11 NOTE — Assessment & Plan Note (Signed)
Remote Labs 

## 2020-04-11 NOTE — Addendum Note (Signed)
Addended by: Sabino Donovan on: 04/11/2020 02:24 PM   Modules accepted: Orders

## 2020-04-11 NOTE — Assessment & Plan Note (Signed)
On Prolia  On Vit D 1000 iu 3/week - high levels in the past

## 2020-04-11 NOTE — Addendum Note (Signed)
Addended by: Merrilyn Puma on: 04/11/2020 11:41 AM   Modules accepted: Orders

## 2020-04-11 NOTE — Patient Instructions (Addendum)
You can try [place] Mushroom extract or capsules for memory Read about Vitamin K2

## 2020-04-18 ENCOUNTER — Ambulatory Visit: Payer: Medicare Other | Admitting: Internal Medicine

## 2020-04-24 ENCOUNTER — Ambulatory Visit (INDEPENDENT_AMBULATORY_CARE_PROVIDER_SITE_OTHER): Payer: Medicare Other | Admitting: Dermatology

## 2020-04-24 ENCOUNTER — Other Ambulatory Visit: Payer: Self-pay

## 2020-04-24 ENCOUNTER — Encounter: Payer: Self-pay | Admitting: Dermatology

## 2020-04-24 DIAGNOSIS — L821 Other seborrheic keratosis: Secondary | ICD-10-CM

## 2020-04-24 DIAGNOSIS — Z1283 Encounter for screening for malignant neoplasm of skin: Secondary | ICD-10-CM | POA: Diagnosis not present

## 2020-04-25 ENCOUNTER — Ambulatory Visit: Payer: Medicare Other | Admitting: Internal Medicine

## 2020-04-26 NOTE — Progress Notes (Signed)
   New Patient   Subjective  Julie Richard is a 74 y.o. female who presents for the following: New Patient (Initial Visit) (Patient here today for a skin check no new concerns).  Annual skin exam Location:  Duration:  Quality:  Associated Signs/Symptoms: Modifying Factors:  Severity:  Timing: Context:    The following portions of the chart were reviewed this encounter and updated as appropriate:     Objective  Well appearing patient in no apparent distress; mood and affect are within normal limits.  A full examination was performed including scalp, head, eyes, ears, nose, lips, neck, chest, axillae, abdomen, back, buttocks, bilateral upper extremities, bilateral lower extremities, hands, feet, fingers, toes, fingernails, and toenails. All findings within normal limits unless otherwise noted below.   Assessment & Plan  Encounter for screening for malignant neoplasm of skin Neck - Anterior  Yearly skin check  Seborrheic keratosis (3) Left Thigh - Anterior; Left Upper Back; Right Lower Back  Okay to leave if stable

## 2020-05-03 ENCOUNTER — Emergency Department (HOSPITAL_BASED_OUTPATIENT_CLINIC_OR_DEPARTMENT_OTHER)
Admission: EM | Admit: 2020-05-03 | Discharge: 2020-05-03 | Disposition: A | Discharge status: Home / Self Care | Payer: Medicare Other | Attending: Emergency Medicine | Admitting: Emergency Medicine

## 2020-05-03 ENCOUNTER — Emergency Department (HOSPITAL_BASED_OUTPATIENT_CLINIC_OR_DEPARTMENT_OTHER): Payer: Medicare Other

## 2020-05-03 ENCOUNTER — Encounter (HOSPITAL_BASED_OUTPATIENT_CLINIC_OR_DEPARTMENT_OTHER): Payer: Self-pay | Admitting: *Deleted

## 2020-05-03 ENCOUNTER — Ambulatory Visit: Payer: Medicare Other

## 2020-05-03 ENCOUNTER — Telehealth: Payer: Self-pay | Admitting: Internal Medicine

## 2020-05-03 ENCOUNTER — Other Ambulatory Visit: Payer: Self-pay

## 2020-05-03 DIAGNOSIS — Z87891 Personal history of nicotine dependence: Secondary | ICD-10-CM | POA: Diagnosis not present

## 2020-05-03 DIAGNOSIS — Z7982 Long term (current) use of aspirin: Secondary | ICD-10-CM | POA: Insufficient documentation

## 2020-05-03 DIAGNOSIS — R079 Chest pain, unspecified: Secondary | ICD-10-CM | POA: Diagnosis not present

## 2020-05-03 LAB — CBC
HCT: 37 % (ref 36.0–46.0)
Hemoglobin: 12.6 g/dL (ref 12.0–15.0)
MCH: 30.5 pg (ref 26.0–34.0)
MCHC: 34.1 g/dL (ref 30.0–36.0)
MCV: 89.6 fL (ref 80.0–100.0)
Platelets: 191 10*3/uL (ref 150–400)
RBC: 4.13 MIL/uL (ref 3.87–5.11)
RDW: 12 % (ref 11.5–15.5)
WBC: 6.7 10*3/uL (ref 4.0–10.5)
nRBC: 0 % (ref 0.0–0.2)

## 2020-05-03 LAB — BASIC METABOLIC PANEL
Anion gap: 10 (ref 5–15)
BUN: 17 mg/dL (ref 8–23)
CO2: 27 mmol/L (ref 22–32)
Calcium: 9.2 mg/dL (ref 8.9–10.3)
Chloride: 103 mmol/L (ref 98–111)
Creatinine, Ser: 0.51 mg/dL (ref 0.44–1.00)
GFR, Estimated: >60 mL/min (ref >60)
Glucose, Bld: 109 mg/dL — ABNORMAL HIGH (ref 70–99)
Potassium: 3.9 mmol/L (ref 3.5–5.1)
Sodium: 140 mmol/L (ref 135–145)

## 2020-05-03 LAB — TROPONIN I (HIGH SENSITIVITY): Troponin I (High Sensitivity): 3 ng/L (ref <18)

## 2020-05-03 LAB — D-DIMER, QUANTITATIVE: D-Dimer, Quant: 0.67 ug/mL-FEU — ABNORMAL HIGH (ref 0.00–0.50)

## 2020-05-03 NOTE — ED Triage Notes (Signed)
Left chest pain x 5 days. Pain has been off and on. Today the pain was worse.

## 2020-05-03 NOTE — ED Provider Notes (Signed)
[facility] EMERGENCY DEPARTMENT Provider Note   CSN: [id number] Arrival date & time: 05/03/20  1442     History Chief Complaint  Patient presents with  . Chest Pain    Julie Richard is a 74 y.o. female.  HPI Patient presents with left-sided chest pain.  It is somewhat sharp on the left chest.  Has had for around 6 days now.  Initially came and went.  Would come and go whenever it wanted to.  Not associated with exertion and in fact was able to exert herself without any pain.  It however is been constant today.  It is not pleuritic.  Is been in the same location.  No nausea or vomiting.  No fevers.  No cough.  No shortness of breath.  Does have a boot on her right foot from broken foot.  No known coronary artery disease but did have an elevated calcium score previously.  Never had heart catheterization.  Does not smoke.  States she is under a lot of stress due to the foot injury and her husband having Alzheimer's.  No radiation of the pain.  States she has had this pain previously in the past.  States she was told that either it was nothing or that it was her lung expanding.    Past Medical History:  Diagnosis Date  . Chicken pox   . Colon polyps   . Hyperlipidemia   . Temporal arteritis (HCC)   . Thyroid disorder     Patient Active Problem List   Diagnosis Date Noted  . Vitamin D toxicity 04/11/2020  . Situational anxiety 02/23/2020  . 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 Coronary atherosclerosis 02/23/2020  . Temporal arteritis (HCC) 06/21/2019  . Osteoporosis 06/21/2019  . Osteoarthritis 06/21/2019  . Scoliosis 06/21/2019  . Dyslipidemia 06/21/2019    Past Surgical History:  Procedure Laterality Date  . ANKLE SURGERY    . CATARACT EXTRACTION    . TONSILLECTOMY AND ADENOIDECTOMY    . TUBAL LIGATION       OB History    Gravida  2   Para  2   Term      Preterm      AB      Living        SAB      TAB      Ectopic      Multiple      Live  Births              Family History  Problem Relation Age of Onset  . Early death Father   . Cancer Sister   . Hyperlipidemia Brother   . Diabetes Maternal Grandmother   . Diabetes Maternal Grandfather     Social History   Tobacco Use  . Smoking status: Former Games developer  . Smokeless tobacco: Never Used  Substance Use Topics  . Alcohol use: Yes    Comment: rarely  . Drug use: Never    Home Medications Prior to Admission medications   Medication Sig Start Date End Date Taking? Authorizing Provider  aspirin EC 81 MG tablet Take 1 tablet (81 mg total) by mouth daily. 12/07/19 12/06/20  Plotnikov, Georgina Quint, MD  baclofen (LIORESAL) 10 MG tablet Take by mouth. 05/02/19   [provider]  CALCIUM PO Take by mouth.    [provider]  Cholecalciferol 50 MCG (2000 UT) TABS 1000 iu 3/week - high levels in the past 10/18/19   Plotnikov, Georgina Quint, MD  denosumab (  PROLIA) 60 MG/ML SOSY injection Inject 60 mg into the skin every 6 (six) months.    [provider]  escitalopram (LEXAPRO) 5 MG tablet Take 1 tablet (5 mg total) by mouth daily. 02/23/20   Plotnikov, Georgina Quint, MD  meloxicam (MOBIC) 7.5 MG tablet TAKE 1 TABLET BY MOUTH AS NEEDED FOR PAIN 02/02/18   [provider]  Multiple Vitamin (MULTI-VITAMIN) tablet Take by mouth.    [provider]  rosuvastatin (CRESTOR) 10 MG tablet TAKE 1 TABLET(10 MG) BY MOUTH DAILY 01/22/20   Plotnikov, Georgina Quint, MD    Allergies    Penicillins  Review of Systems   Review of Systems  Constitutional: Negative for appetite change and diaphoresis.  HENT: Negative for dental problem.   Respiratory: Negative for shortness of breath.   Cardiovascular: Positive for chest pain.  Gastrointestinal: Negative for abdominal pain, nausea and vomiting.  Genitourinary: Negative for flank pain.  Musculoskeletal: Negative for back pain.       Left foot injury.  Skin: Negative for rash.  Neurological: Negative for weakness  and numbness.  Psychiatric/Behavioral: Negative for suicidal ideas.    Physical Exam Updated Vital Signs BP 119/74 (BP Location: Left Arm)   Pulse 64   Temp 98.4 F (36.9 C) (Oral)   Resp 13   Ht 5\' 1"  (1.549 m)   Wt 45.4 kg   SpO2 98%   BMI 18.91 kg/m   Physical Exam Vitals and nursing note reviewed.  HENT:     Head: Normocephalic.  Cardiovascular:     Rate and Rhythm: Normal rate and regular rhythm.  Pulmonary:     Breath sounds: No wheezing, rhonchi or rales.  Chest:     Chest wall: No tenderness.  Abdominal:     Palpations: There is no mass.     Tenderness: There is no abdominal tenderness.  Musculoskeletal:     Cervical back: Normal range of motion and neck supple.     Left lower leg: No edema.     Comments: Right lower leg in cam walker  Skin:    General: Skin is warm.     Capillary Refill: Capillary refill takes less than 2 seconds.  Neurological:     Mental Status: She is alert and oriented to person, place, and time.     ED Results / Procedures / Treatments   Labs (all labs ordered are listed, but only abnormal results are displayed) Labs Reviewed  BASIC METABOLIC PANEL - Abnormal; Notable for the following components:      Result Value   Glucose, Bld 109 (*)    All other components within normal limits  D-DIMER, QUANTITATIVE (NOT AT [facility]) - Abnormal; Notable for the following components:   D-Dimer, Quant 0.67 (*)    All other components within normal limits  CBC  TROPONIN I (HIGH SENSITIVITY)    EKG EKG Interpretation  Date/Time:  Thursday May 03 2020 15:03:16 EDT Ventricular Rate:  71 PR Interval:    QRS Duration: 98 QT Interval:  396 QTC Calculation: 431 R Axis:   82 Text Interpretation: Sinus rhythm Borderline right axis deviation RSR' in V1 or V2, probably normal variant Confirmed by [id number] [phone]) on 05/03/2020 3:22:57 PM   Radiology DG Chest 1 View  Result Date: 05/03/2020 CLINICAL DATA:  Chest pain EXAM: CHEST   1 VIEW COMPARISON:  05/03/2020 FINDINGS: Repeat frontal view chest with nipple markers. Previously noted nodular opacities correspond to the nipples. No focal opacity or pleural effusion. Normal  cardiomediastinal silhouette. No pneumothorax. Scoliosis of the spine. IMPRESSION: Negative. Electronically Signed   By: Jasmine Pang M.D.   On: 05/03/2020 16:27   DG Chest 2 View  Result Date: 05/03/2020 CLINICAL DATA:  Chest pain. EXAM: CHEST - 2 VIEW COMPARISON:  CT 11/29/2019. FINDINGS: Mediastinum and hilar structures normal. Heart size normal. Small nodular density noted over the left lung base most likely a nipple shadow. Repeat chest x-ray with nipple markers suggested. Previously identified small nodular density in the periphery of the left lung base not definitely identified. No focal infiltrate. No pleural effusion or pneumothorax. Degenerative changes scoliosis thoracic spine IMPRESSION: Small nodular density noted over the left lung base most likely nipple shadow. Repeat chest x-ray with nipple markers suggested. Previously identified nodular density in the periphery of the left lung base noted on CT of 11/29/2019 not definitely identified. Reference is made to CT report of 11/29/2019. No acute infiltrate noted. Electronically Signed   By: Maisie Fus  Register   On: 05/03/2020 15:57    Procedures Procedures (including critical care time)  Medications Ordered in ED Medications - No data to display  ED Course  I have reviewed the triage vital signs and the nursing notes.  Pertinent labs & imaging results that were available during my care of the patient were reviewed by me and considered in my medical decision making (see chart for details).    MDM Rules/Calculators/A&P                          Patient with left-sided chest pain.  States she is under a lot of stress.  EKG reassuring.  Troponin negative.  Patient has a heart pathway score of 3.  D-dimer also done and age-adjusted to normal.   Chest x-ray does not show infiltrate.  Initial nodule was nipple and was proven with nipple marker repeat.  Doubt cardiac cause of the pain.  Doubt severe cause of the pain.  Will discharge home with outpatient follow-up. I reviewed imaging EKGs and blood work.  Differential diagnosis included pulmonary embolism, cardiac cause, pneumothorax, pneumonia. Final Clinical Impression(s) / ED Diagnoses Final diagnoses:  Nonspecific chest pain    Rx / DC Orders ED Discharge Orders    None       Benjiman Core, MD 05/04/20 0009

## 2020-05-03 NOTE — Telephone Encounter (Signed)
[facility] Report/Call: ---Caller states that she is having a sharp pain in her left lung. Caller states she has had a lot of stress recently. Pain is 4 out of 10 pain scale. Took Ibuprofen at night. No SOB. Pain started 4 days ago. She has a broken foot and is wearing a boot. No fever. No chest congestion. No cardiac history  Advised CALL EMS now.

## 2020-05-03 NOTE — ED Notes (Signed)
Pt on monitor 

## 2020-05-04 NOTE — Telephone Encounter (Signed)
Noted. Thanks.

## 2020-05-11 ENCOUNTER — Other Ambulatory Visit: Payer: Self-pay | Admitting: Family

## 2020-05-11 ENCOUNTER — Ambulatory Visit (INDEPENDENT_AMBULATORY_CARE_PROVIDER_SITE_OTHER): Payer: Medicare Other

## 2020-05-11 ENCOUNTER — Ambulatory Visit (INDEPENDENT_AMBULATORY_CARE_PROVIDER_SITE_OTHER): Payer: Medicare Other | Admitting: Family

## 2020-05-11 ENCOUNTER — Other Ambulatory Visit: Payer: Self-pay

## 2020-05-11 ENCOUNTER — Encounter: Payer: Self-pay | Admitting: Family

## 2020-05-11 VITALS — BP 122/58 | HR 70 | Temp 97.9°F | Ht 61.0 in | Wt 104.0 lb

## 2020-05-11 DIAGNOSIS — R0782 Intercostal pain: Secondary | ICD-10-CM | POA: Diagnosis not present

## 2020-05-11 DIAGNOSIS — R0789 Other chest pain: Secondary | ICD-10-CM

## 2020-05-11 MED ORDER — MELOXICAM 15 MG PO TABS: 15.0000 mg | ORAL_TABLET | Freq: Every day | ORAL | 0 refills | Status: DC

## 2020-05-11 NOTE — Patient Instructions (Signed)
Costochondritis  Costochondritis is swelling and irritation (inflammation) of the tissue (cartilage) that connects your ribs to your breastbone (sternum). This causes pain in the front of your chest. The pain usually starts gradually and involves more than one rib. What are the causes? The exact cause of this condition is not always known. It results from stress on the cartilage where your ribs attach to your sternum. The cause of this stress could be:  Chest injury (trauma).  Exercise or activity, such as lifting.  Severe coughing. What increases the risk? You may be at higher risk for this condition if you:  Are female.  Are 30?75 years old.  Recently started a new exercise or work activity.  Have low levels of vitamin D.  Have a condition that makes you cough frequently. What are the signs or symptoms? The main symptom of this condition is chest pain. The pain:  Usually starts gradually and can be sharp or dull.  Gets worse with deep breathing, coughing, or exercise.  Gets better with rest.  May be worse when you press on the sternum-rib connection (tenderness). How is this diagnosed? This condition is diagnosed based on your symptoms, medical history, and a physical exam. Your health care provider will check for tenderness when pressing on your sternum. This is the most important finding. You may also have tests to rule out other causes of chest pain. These may include:  A chest X-ray to check for lung problems.  An electrocardiogram (ECG) to see if you have a heart problem that could be causing the pain.  An imaging scan to rule out a chest or rib fracture. How is this treated? This condition usually goes away on its own over time. Your health care provider may prescribe an NSAID to reduce pain and inflammation. Your health care provider may also suggest that you:  Rest and avoid activities that make pain worse.  Apply heat or cold to the area to reduce pain and  inflammation.  Do exercises to stretch your chest muscles. If these treatments do not help, your health care provider may inject a numbing medicine at the sternum-rib connection to help relieve the pain. Follow these instructions at home:  Avoid activities that make pain worse. This includes any activities that use chest, abdominal, and side muscles.  If directed, put ice on the painful area: ? Put ice in a plastic bag. ? Place a towel between your skin and the bag. ? Leave the ice on for 20 minutes, 2-3 times a day.  If directed, apply heat to the affected area as often as told by your health care provider. Use the heat source that your health care provider recommends, such as a moist heat pack or a heating pad. ? Place a towel between your skin and the heat source. ? Leave the heat on for 20-30 minutes. ? Remove the heat if your skin turns bright red. This is especially important if you are unable to feel pain, heat, or cold. You may have a greater risk of getting burned.  Take over-the-counter and prescription medicines only as told by your health care provider.  Return to your normal activities as told by your health care provider. Ask your health care provider what activities are safe for you.  Keep all follow-up visits as told by your health care provider. This is important. Contact a health care provider if:  You have chills or a fever.  Your pain does not go away or it gets   worse.  You have a cough that does not go away (is persistent). Get help right away if:  You have shortness of breath. This information is not intended to replace advice given to you by your health care provider. Make sure you discuss any questions you have with your health care provider. Document Revised: 07/08/2017 Document Reviewed: 10/17/2015 Elsevier Patient Education  2020 [employer]  

## 2020-05-11 NOTE — Progress Notes (Signed)
Julie Richard is a 74 y.o. female with the following history as recorded in [employer]:  Patient Active Problem List   Diagnosis Date Noted  . Vitamin D toxicity 04/11/2020  . Situational anxiety 02/23/2020  . 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 Coronary atherosclerosis 02/23/2020  . Temporal arteritis (HCC) 06/21/2019  . Osteoporosis 06/21/2019  . Osteoarthritis 06/21/2019  . Scoliosis 06/21/2019  . Dyslipidemia 06/21/2019    Current Outpatient Medications  Medication Sig Dispense Refill  . aspirin EC 81 MG tablet Take 1 tablet (81 mg total) by mouth daily. 100 tablet 3  . baclofen (LIORESAL) 10 MG tablet Take by mouth.    Marland Kitchen CALCIUM PO Take by mouth.    . Cholecalciferol 50 MCG (2000 UT) TABS 1000 iu 3/week - high levels in the past 100 tablet 3  . denosumab (PROLIA) 60 MG/ML SOSY injection Inject 60 mg into the skin every 6 (six) months.    . escitalopram (LEXAPRO) 5 MG tablet Take 1 tablet (5 mg total) by mouth daily. 30 tablet 5  . meloxicam (MOBIC) 15 MG tablet Take 1 tablet (15 mg total) by mouth daily. 30 tablet 0  . Multiple Vitamin (MULTI-VITAMIN) tablet Take by mouth.    . rosuvastatin (CRESTOR) 10 MG tablet TAKE 1 TABLET(10 MG) BY MOUTH DAILY 90 tablet 3   No current facility-administered medications for this visit.    Allergies: Penicillins  Past Medical History:  Diagnosis Date  . Chicken pox   . Colon polyps   . Hyperlipidemia   . Temporal arteritis (HCC)   . Thyroid disorder     Past Surgical History:  Procedure Laterality Date  . ANKLE SURGERY    . CATARACT EXTRACTION    . TONSILLECTOMY AND ADENOIDECTOMY    . TUBAL LIGATION      Family History  Problem Relation Age of Onset  . Early death Father   . Cancer Sister   . Hyperlipidemia Brother   . Diabetes Maternal Grandmother   . Diabetes Maternal Grandfather     Social History   Tobacco Use  . Smoking status: Former Games developer  . Smokeless tobacco: Never Used  Substance Use Topics  . Alcohol use: Yes     Comment: rarely    Subjective:  Recurrent intermittent left sided chest pain; was seen at ER last week and normal CXR, EKG and D-dimer; per patient, feels like pain is located in front and directly behind her left breast; is up to date on her mammograms; symptoms are more noticeable with movement; has been using Ibuprofen and Tylenol intermittently; no rash noted at site of symptoms; has had these type of symptoms in the past- on and of for years; this particular episode is lasting longer than normal however;   Objective:  Vitals:   05/11/20 1446  BP: (!) 122/58  Pulse: 70  Temp: 97.9 F (36.6 C)  TempSrc: Oral  SpO2: 99%  Weight: 104 lb (47.2 kg)  Height: 5\' 1"  (1.549 m)    General: Well developed, well nourished, in no acute distress  Skin : Warm and dry.  Head: Normocephalic and atraumatic  Lungs: Respirations unlabored; clear to auscultation bilaterally without wheeze, rales, rhonchi  CVS exam: normal rate and regular rhythm.  Musculoskeletal: No deformities; no active joint inflammation  Extremities: No edema, cyanosis, clubbing  Vessels: Symmetric bilaterally  Neurologic: Alert and oriented; speech intact; face symmetrical; moves all extremities well; CNII-XII intact without focal deficit   Assessment:  1. Atypical chest pain   2. Intercostal  pain     Plan:  ? Costochondirits; check left rib X-ray today; will also update Chest CT; trial of Mobic 15 mg daily; can try applying ice to affected area as well; follow-up to be determined;   No follow-ups on file.  Orders Placed This Encounter  Procedures  . DG Ribs Unilateral Left    Standing Status:   Future    Number of Occurrences:   1    Standing Expiration Date:   05/11/2021    Order Specific Question:   Reason for Exam (SYMPTOM  OR DIAGNOSIS REQUIRED)    Answer:   left rib pain    Order Specific Question:   Preferred imaging location?    Answer:   [facility]  . CT Chest Wo Contrast    Standing Status:    Future    Standing Expiration Date:   05/11/2021    Order Specific Question:   Preferred imaging location?    Answer:   [facility]    Requested Prescriptions   Signed Prescriptions Disp Refills  . meloxicam (MOBIC) 15 MG tablet 30 tablet 0    Sig: Take 1 tablet (15 mg total) by mouth daily.

## 2020-05-16 ENCOUNTER — Ambulatory Visit (HOSPITAL_BASED_OUTPATIENT_CLINIC_OR_DEPARTMENT_OTHER)
Admission: RE | Admit: 2020-05-16 | Discharge: 2020-05-16 | Disposition: A | Discharge status: Home / Self Care | Payer: Medicare Other | Source: Ambulatory Visit | Attending: Family | Admitting: Family

## 2020-05-16 ENCOUNTER — Other Ambulatory Visit: Payer: Self-pay

## 2020-05-16 DIAGNOSIS — R0782 Intercostal pain: Secondary | ICD-10-CM | POA: Diagnosis present

## 2020-05-17 ENCOUNTER — Ambulatory Visit: Payer: Medicare Other

## 2020-05-17 ENCOUNTER — Telehealth: Payer: Self-pay

## 2020-05-17 NOTE — Telephone Encounter (Signed)
Result notes given of rib fracture.

## 2020-05-23 ENCOUNTER — Other Ambulatory Visit: Payer: Self-pay

## 2020-05-23 ENCOUNTER — Ambulatory Visit (INDEPENDENT_AMBULATORY_CARE_PROVIDER_SITE_OTHER): Payer: Medicare Other

## 2020-05-23 VITALS — BP 110/60 | HR 67 | Temp 97.6°F | Ht 61.0 in | Wt 102.6 lb

## 2020-05-23 DIAGNOSIS — Z0000 Encounter for general adult medical examination without abnormal findings: Secondary | ICD-10-CM

## 2020-05-23 NOTE — Patient Instructions (Signed)
Ms. Julie Richard , Thank you for taking time to come for your Medicare Wellness Visit. I appreciate your ongoing commitment to your health goals. Please review the following plan we discussed and let me know if I can assist you in the future.   Screening recommendations/referrals: Colonoscopy: no repeat due to age Mammogram: 01/23/2020 Bone Density: never done Recommended yearly ophthalmology/optometry visit for glaucoma screening and checkup Recommended yearly dental visit for hygiene and checkup  Vaccinations: Influenza vaccine: 04/23/2020 Pneumococcal vaccine: up to date Tdap vaccine: up to date Shingles vaccine: up to date   Covid-19: up to date  Advanced directives: Please bring a copy of your health care power of attorney and living will to the office at your convenience.   Conditions/risks identified: Yes; Reviewed health maintenance screenings with patient today and relevant education, vaccines, and/or referrals were provided. Please continue to do your personal lifestyle choices by: daily care of teeth and gums, regular physical activity (goal should be 5 days a week for 30 minutes), eat a healthy diet, avoid tobacco and drug use, limiting any alcohol intake, taking a low-dose aspirin (if not allergic or have been advised by your provider otherwise) and taking vitamins and minerals as recommended by your provider. Continue doing brain stimulating activities (puzzles, reading, adult coloring books, staying active) to keep memory sharp. Continue to eat heart healthy diet (full of fruits, vegetables, whole grains, lean protein, water--limit salt, fat, and sugar intake) and increase physical activity as tolerated.  Next appointment: Please schedule your next Medicare Wellness Visit with your Nurse Health Advisor in 1 year by calling [phone].   Preventive Care 3 Years and Older, Female Preventive care refers to lifestyle choices and visits with your health care provider that can promote  health and wellness. What does preventive care include?  A yearly physical exam. This is also called an annual well check.  Dental exams once or twice a year.  Routine eye exams. Ask your health care provider how often you should have your eyes checked.  Personal lifestyle choices, including:  Daily care of your teeth and gums.  Regular physical activity.  Eating a healthy diet.  Avoiding tobacco and drug use.  Limiting alcohol use.  Practicing safe sex.  Taking low-dose aspirin every day.  Taking vitamin and mineral supplements as recommended by your health care provider. What happens during an annual well check? The services and screenings done by your health care provider during your annual well check will depend on your age, overall health, lifestyle risk factors, and family history of disease. Counseling  Your health care provider may ask you questions about your:  Alcohol use.  Tobacco use.  Drug use.  Emotional well-being.  Home and relationship well-being.  Sexual activity.  Eating habits.  History of falls.  Memory and ability to understand (cognition).  Work and work Astronomer.  Reproductive health. Screening  You may have the following tests or measurements:  Height, weight, and BMI.  Blood pressure.  Lipid and cholesterol levels. These may be checked every 5 years, or more frequently if you are over 62 years old.  Skin check.  Lung cancer screening. You may have this screening every year starting at age 75 if you have a 30-pack-year history of smoking and currently smoke or have quit within the past 15 years.  Fecal occult blood test (FOBT) of the stool. You may have this test every year starting at age 60.  Flexible sigmoidoscopy or colonoscopy. You may have a sigmoidoscopy every  5 years or a colonoscopy every 10 years starting at age 70.  Hepatitis C blood test.  Hepatitis B blood test.  Sexually transmitted disease (STD)  testing.  Diabetes screening. This is done by checking your blood sugar (glucose) after you have not eaten for a while (fasting). You may have this done every 1-3 years.  Bone density scan. This is done to screen for osteoporosis. You may have this done starting at age 41.  Mammogram. This may be done every 1-2 years. Talk to your health care provider about how often you should have regular mammograms. Talk with your health care provider about your test results, treatment options, and if necessary, the need for more tests. Vaccines  Your health care provider may recommend certain vaccines, such as:  Influenza vaccine. This is recommended every year.  Tetanus, diphtheria, and acellular pertussis (Tdap, Td) vaccine. You may need a Td booster every 10 years.  Zoster vaccine. You may need this after age 89.  Pneumococcal 13-valent conjugate (PCV13) vaccine. One dose is recommended after age 96.  Pneumococcal polysaccharide (PPSV23) vaccine. One dose is recommended after age 20. Talk to your health care provider about which screenings and vaccines you need and how often you need them. This information is not intended to replace advice given to you by your health care provider. Make sure you discuss any questions you have with your health care provider. Document Released: 07/20/2015 Document Revised: 03/12/2016 Document Reviewed: 04/24/2015 Elsevier Interactive Patient Education  2017 [place] Prevention in the Home Falls can cause injuries. They can happen to people of all ages. There are many things you can do to make your home safe and to help prevent falls. What can I do on the outside of my home?  Regularly fix the edges of walkways and driveways and fix any cracks.  Remove anything that might make you trip as you walk through a door, such as a raised step or threshold.  Trim any bushes or trees on the path to your home.  Use bright outdoor lighting.  Clear any walking  paths of anything that might make someone trip, such as rocks or tools.  Regularly check to see if handrails are loose or broken. Make sure that both sides of any steps have handrails.  Any raised decks and porches should have guardrails on the edges.  Have any leaves, snow, or ice cleared regularly.  Use sand or salt on walking paths during winter.  Clean up any spills in your garage right away. This includes oil or grease spills. What can I do in the bathroom?  Use night lights.  Install grab bars by the toilet and in the tub and shower. Do not use towel bars as grab bars.  Use non-skid mats or decals in the tub or shower.  If you need to sit down in the shower, use a plastic, non-slip stool.  Keep the floor dry. Clean up any water that spills on the floor as soon as it happens.  Remove soap buildup in the tub or shower regularly.  Attach bath mats securely with double-sided non-slip rug tape.  Do not have throw rugs and other things on the floor that can make you trip. What can I do in the bedroom?  Use night lights.  Make sure that you have a light by your bed that is easy to reach.  Do not use any sheets or blankets that are too big for your bed. They should not  hang down onto the floor.  Have a firm chair that has side arms. You can use this for support while you get dressed.  Do not have throw rugs and other things on the floor that can make you trip. What can I do in the kitchen?  Clean up any spills right away.  Avoid walking on wet floors.  Keep items that you use a lot in easy-to-reach places.  If you need to reach something above you, use a strong step stool that has a grab bar.  Keep electrical cords out of the way.  Do not use floor polish or wax that makes floors slippery. If you must use wax, use non-skid floor wax.  Do not have throw rugs and other things on the floor that can make you trip. What can I do with my stairs?  Do not leave any items  on the stairs.  Make sure that there are handrails on both sides of the stairs and use them. Fix handrails that are broken or loose. Make sure that handrails are as long as the stairways.  Check any carpeting to make sure that it is firmly attached to the stairs. Fix any carpet that is loose or worn.  Avoid having throw rugs at the top or bottom of the stairs. If you do have throw rugs, attach them to the floor with carpet tape.  Make sure that you have a light switch at the top of the stairs and the bottom of the stairs. If you do not have them, ask someone to add them for you. What else can I do to help prevent falls?  Wear shoes that:  Do not have high heels.  Have rubber bottoms.  Are comfortable and fit you well.  Are closed at the toe. Do not wear sandals.  If you use a stepladder:  Make sure that it is fully opened. Do not climb a closed stepladder.  Make sure that both sides of the stepladder are locked into place.  Ask someone to hold it for you, if possible.  Clearly mark and make sure that you can see:  Any grab bars or handrails.  First and last steps.  Where the edge of each step is.  Use tools that help you move around (mobility aids) if they are needed. These include:  Canes.  Walkers.  Scooters.  Crutches.  Turn on the lights when you go into a dark area. Replace any light bulbs as soon as they burn out.  Set up your furniture so you have a clear path. Avoid moving your furniture around.  If any of your floors are uneven, fix them.  If there are any pets around you, be aware of where they are.  Review your medicines with your doctor. Some medicines can make you feel dizzy. This can increase your chance of falling. Ask your doctor what other things that you can do to help prevent falls. This information is not intended to replace advice given to you by your health care provider. Make sure you discuss any questions you have with your health care  provider. Document Released: 04/19/2009 Document Revised: 11/29/2015 Document Reviewed: 07/28/2014 Elsevier Interactive Patient Education  2017 [employer].

## 2020-05-23 NOTE — Progress Notes (Addendum)
Subjective:   Julie Richard is a 74 y.o. female who presents for Medicare Annual (Subsequent) preventive examination.  Review of Systems    NO ROS. Medicare Wellness Visit. Cardiac Risk Factors include: advanced age (>3355men, 25>65 women);dyslipidemia     Objective:    Today's Vitals   05/23/20 1258  BP: 110/60  Pulse: 67  Temp: 97.6 F (36.4 C)  SpO2: 99%  Weight: 102 lb 9.6 oz (46.5 kg)  Height: 5\' 1"  (1.549 m)  PainSc: 0-No pain   Body mass index is 19.39 kg/m.  Advanced Directives 05/23/2020 05/03/2020  Does Patient Have a Medical Advance Directive? Yes Yes  Type of Advance Directive Living will;Healthcare Power of [employer] Healthcare Power of [place];Living will  Does patient want to make changes to medical advance directive? No - Patient declined -  Copy of Healthcare Power of Attorney in Chart? No - copy requested -  Would patient like information on creating a medical advance directive? - No - Patient declined    Current Medications (verified) Outpatient Encounter Medications as of 05/23/2020  Medication Sig   aspirin EC 81 MG tablet Take 1 tablet (81 mg total) by mouth daily.   baclofen (LIORESAL) 10 MG tablet Take by mouth.   CALCIUM PO Take by mouth.   Cholecalciferol 50 MCG (2000 UT) TABS 1000 iu 3/week - high levels in the past   denosumab (PROLIA) 60 MG/ML SOSY injection Inject 60 mg into the skin every 6 (six) months.   escitalopram (LEXAPRO) 5 MG tablet Take 1 tablet (5 mg total) by mouth daily.   meloxicam (MOBIC) 15 MG tablet TAKE 1 TABLET(15 MG) BY MOUTH DAILY   Multiple Vitamin (MULTI-VITAMIN) tablet Take by mouth.   rosuvastatin (CRESTOR) 10 MG tablet TAKE 1 TABLET(10 MG) BY MOUTH DAILY   No facility-administered encounter medications on file as of 05/23/2020.    Allergies (verified) Penicillins   History: Past Medical History:  Diagnosis Date   Chicken pox    Colon polyps    Hyperlipidemia    Temporal arteritis (HCC)    Thyroid  disorder    Past Surgical History:  Procedure Laterality Date   ANKLE SURGERY     CATARACT EXTRACTION     TONSILLECTOMY AND ADENOIDECTOMY     TUBAL LIGATION     Family History  Problem Relation Age of Onset   Early death Father    Cancer Sister    Hyperlipidemia Brother    Diabetes Maternal Grandmother    Diabetes Maternal Grandfather    Social History   Socioeconomic History   Marital status: Married    Spouse name: Not on file   Number of children: Not on file   Years of education: Not on file   Highest education level: Not on file  Occupational History   Not on file  Tobacco Use   Smoking status: Former Smoker   Smokeless tobacco: Never Used  Substance and Sexual Activity   Alcohol use: Yes    Comment: rarely   Drug use: Never   Sexual activity: Yes  Other Topics Concern   Not on file  Social History Narrative   Not on file   Social Determinants of Health   Financial Resource Strain: Low Risk    Difficulty of Paying Living Expenses: Not hard at all  Food Insecurity: No Food Insecurity   Worried About Programme researcher, broadcasting/film/videounning Out of Food in the Last Year: Never true   Ran Out of Food in the Last Year: Never  true  Transportation Needs: No Transportation Needs   Lack of Transportation (Medical): No   Lack of Transportation (Non-Medical): No  Physical Activity: Inactive   Days of Exercise per Week: 0 days   Minutes of Exercise per Session: 0 min  Stress: Stress Concern Present   Feeling of Stress : Very much  Social Connections:    Frequency of Communication with Friends and Family: Not on file   Frequency of Social Gatherings with Friends and Family: Not on file   Attends Religious Services: Not on file   Active Member of Clubs or Organizations: Not on file   Attends Banker Meetings: Not on file   Marital Status: Not on file    Tobacco Counseling Counseling given: Not Answered   Clinical Intake:     Pain : No/denies pain Pain Score: 0-No pain       BMI - recorded: 19.39 Nutritional Status: BMI of 19-24  Normal Nutritional Risks: None Diabetes: No  How often do you need to have someone help you when you read instructions, pamphlets, or other written materials from your doctor or pharmacy?: 1 - Never What is the last grade level you completed in school?: BS in Elementary Education  Diabetic? no  Interpreter Needed?: No  Information entered by :: Susie Cassette, LPN   Activities of Daily Living In your present state of health, do you have any difficulty performing the following activities: 05/23/2020 04/11/2020  Hearing? N N  Vision? N N  Difficulty concentrating or making decisions? N N  Walking or climbing stairs? N N  Dressing or bathing? N N  Doing errands, shopping? N N  Preparing Food and eating ? N -  Using the Toilet? N -  In the past six months, have you accidently leaked urine? N -  Do you have problems with loss of bowel control? N -  Managing your Medications? N -  Managing your Finances? N -  Housekeeping or managing your Housekeeping? N -  Some recent data might be hidden    Patient Care Team: Plotnikov, Georgina Quint, MD as PCP - General (Internal Medicine)  Indicate any recent Medical Services you may have received from other than [facility] providers in the past year (date may be approximate).     Assessment:   This is a routine wellness examination for Julie Richard.  Hearing/Vision screen No exam data present  Dietary issues and exercise activities discussed: Current Exercise Habits: The patient does not participate in regular exercise at present, Exercise limited by: None identified  Goals   None    Depression Screen PHQ 2/9 Scores 05/23/2020 04/11/2020 06/21/2019  PHQ - 2 Score 0 0 0  PHQ- 9 Score - 0 -    Fall Risk Fall Risk  05/23/2020 06/21/2019  Falls in the past year? 0 0  Number falls in past yr: 0 -  Injury with Fall? 0 -  Risk for fall due to : No Fall Risks -  Follow up Falls evaluation  completed;Education provided Falls evaluation completed    Any stairs in or around the home? No  If so, are there any without handrails? No  Home free of loose throw rugs in walkways, pet beds, electrical cords, etc? Yes  Adequate lighting in your home to reduce risk of falls? Yes   ASSISTIVE DEVICES UTILIZED TO PREVENT FALLS:  Life alert? No  Use of a cane, walker or w/c? No  Grab bars in the bathroom? Yes  Shower chair or bench  in shower? No  Elevated toilet seat or a handicapped toilet? No   TIMED UP AND GO:  Was the test performed? No .  Length of time to ambulate 10 feet: 0 sec.   Gait steady and fast without use of assistive device  Cognitive Function:     6CIT Screen 05/23/2020  What Year? 0 points  What month? 0 points  What time? 0 points  Count back from 20 0 points  Months in reverse 0 points  Repeat phrase 0 points  Total Score 0    Immunizations Immunization History  Administered Date(s) Administered   Influenza Split 04/30/2010, 04/09/2012   Influenza, High Dose Seasonal PF 03/31/2016, 05/11/2017, 05/07/2018, 04/09/2019   PFIZER SARS-COV-2 Vaccination 07/31/2019, 04/03/2020   Pneumococcal Conjugate-13 11/30/2013   Pneumococcal Polysaccharide-23 07/07/2013   Pneumococcal-Unspecified 08/22/2010   Td 11/11/2005   Tdap 07/08/2015   Zoster 03/15/2008   Zoster Recombinat (Shingrix) 03/16/2018, 06/06/2018    TDAP status: Up to date Flu Vaccine status: Up to date Pneumococcal vaccine status: Up to date Covid-19 vaccine status: Completed vaccines  Qualifies for Shingles Vaccine? Yes   Zostavax completed Yes   Shingrix Completed?: Yes  Screening Tests Health Maintenance  Topic Date Due   Hepatitis C Screening  Never done   COLONOSCOPY  Never done   DEXA SCAN  Never done   INFLUENZA VACCINE  02/05/2020   TETANUS/TDAP  07/07/2025   COVID-19 Vaccine  Completed   PNA vac Low Risk Adult  Completed    Health Maintenance  Health Maintenance Due    Topic Date Due   Hepatitis C Screening  Never done   COLONOSCOPY  Never done   DEXA SCAN  Never done   INFLUENZA VACCINE  02/05/2020    Colorectal cancer screening: No longer required.  Mammogram status: Completed 01/23/2020. Repeat every year Bone density status: never done  Lung Cancer Screening: (Low Dose CT Chest recommended if Age 60-80 years, 30 pack-year currently smoking OR have quit w/in 15years.) does not qualify.   Lung Cancer Screening Referral: no  Additional Screening:  Hepatitis C Screening: does qualify; Completed no  Vision Screening: Recommended annual ophthalmology exams for early detection of glaucoma and other disorders of the eye. Is the patient up to date with their annual eye exam?  Yes  Who is the provider or what is the name of the office in which the patient attends annual eye exams? Anders Grant, MD If pt is not established with a provider, would they like to be referred to a provider to establish care? No .   Dental Screening: Recommended annual dental exams for proper oral hygiene  Community Resource Referral / Chronic Care Management: CRR required this visit?  No   CCM required this visit?  No      Plan:     I have personally reviewed and noted the following in the patient's chart:   Medical and social history Use of alcohol, tobacco or illicit drugs  Current medications and supplements Functional ability and status Nutritional status Physical activity Advanced directives List of other physicians Hospitalizations, surgeries, and ER visits in previous 12 months Vitals Screenings to include cognitive, depression, and falls Referrals and appointments  In addition, I have reviewed and discussed with patient certain preventive protocols, quality metrics, and best practice recommendations. A written personalized care plan for preventive services as well as general preventive health recommendations were provided to patient.      Mickeal Needy, LPN   [id number]  Nurse Notes: n/a   Medical screening examination/treatment/procedure(s) were performed by non-physician practitioner and as supervising physician I was immediately available for consultation/collaboration.  I agree with above. Jacinta Shoe, MD

## 2020-06-02 ENCOUNTER — Encounter: Payer: Self-pay | Admitting: Dermatology

## 2020-06-02 NOTE — Addendum Note (Signed)
Addended by: Janalyn Harder on: 06/02/2020 03:32 PM   Modules accepted: Level of Service

## 2020-06-05 ENCOUNTER — Telehealth: Payer: Self-pay | Admitting: Internal Medicine

## 2020-06-05 NOTE — Telephone Encounter (Signed)
Patient has been having some back pain and is wondering if she can get a referral to orthopedics. [phone]

## 2020-06-06 NOTE — Telephone Encounter (Signed)
Informed patient that she needs an OV with [place] for her back pain first. [place]  And will call back to schedule her appt.

## 2020-06-06 NOTE — Telephone Encounter (Signed)
Please see [place] first.  Thanks

## 2020-07-12 ENCOUNTER — Ambulatory Visit: Payer: Medicare Other | Admitting: Internal Medicine

## 2020-07-17 ENCOUNTER — Telehealth (INDEPENDENT_AMBULATORY_CARE_PROVIDER_SITE_OTHER): Payer: Medicare Other | Admitting: Family

## 2020-07-17 ENCOUNTER — Ambulatory Visit (INDEPENDENT_AMBULATORY_CARE_PROVIDER_SITE_OTHER)
Admission: RE | Admit: 2020-07-17 | Discharge: 2020-07-17 | Disposition: A | Discharge status: Home / Self Care | Payer: Medicare Other | Source: Ambulatory Visit | Attending: Family | Admitting: Family

## 2020-07-17 ENCOUNTER — Other Ambulatory Visit (INDEPENDENT_AMBULATORY_CARE_PROVIDER_SITE_OTHER): Payer: Medicare Other

## 2020-07-17 ENCOUNTER — Other Ambulatory Visit: Payer: Self-pay

## 2020-07-17 DIAGNOSIS — R0781 Pleurodynia: Secondary | ICD-10-CM

## 2020-07-17 DIAGNOSIS — M810 Age-related osteoporosis without current pathological fracture: Secondary | ICD-10-CM | POA: Diagnosis not present

## 2020-07-17 DIAGNOSIS — R339 Retention of urine, unspecified: Secondary | ICD-10-CM

## 2020-07-17 DIAGNOSIS — R350 Frequency of micturition: Secondary | ICD-10-CM

## 2020-07-17 LAB — URINALYSIS
Bilirubin Urine: NEGATIVE
Ketones, ur: NEGATIVE
Leukocytes,Ua: NEGATIVE
Nitrite: NEGATIVE
Specific Gravity, Urine: 1.01 (ref 1.000–1.030)
Total Protein, Urine: NEGATIVE
Urine Glucose: NEGATIVE
Urobilinogen, UA: 0.2 (ref 0.0–1.0)
pH: 5.5 (ref 5.0–8.0)

## 2020-07-17 NOTE — Progress Notes (Signed)
Julie Richard is a 75 y.o. female with the following history as recorded in [facility]:  Patient Active Problem List   Diagnosis Date Noted  . Vitamin D toxicity 04/11/2020  . Situational anxiety 02/23/2020  . 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 Coronary atherosclerosis 02/23/2020  . Temporal arteritis (HCC) 06/21/2019  . Osteoporosis 06/21/2019  . Osteoarthritis 06/21/2019  . Scoliosis 06/21/2019  . Dyslipidemia 06/21/2019    Current Outpatient Medications  Medication Sig Dispense Refill  . aspirin EC 81 MG tablet Take 1 tablet (81 mg total) by mouth daily. 100 tablet 3  . baclofen (LIORESAL) 10 MG tablet Take by mouth.    Marland Kitchen CALCIUM PO Take by mouth.    . Cholecalciferol 50 MCG (2000 UT) TABS 1000 iu 3/week - high levels in the past 100 tablet 3  . denosumab (PROLIA) 60 MG/ML SOSY injection Inject 60 mg into the skin every 6 (six) months.    . escitalopram (LEXAPRO) 5 MG tablet Take 1 tablet (5 mg total) by mouth daily. 30 tablet 5  . meloxicam (MOBIC) 15 MG tablet TAKE 1 TABLET(15 MG) BY MOUTH DAILY 90 tablet 0  . Multiple Vitamin (MULTI-VITAMIN) tablet Take by mouth.    . rosuvastatin (CRESTOR) 10 MG tablet TAKE 1 TABLET(10 MG) BY MOUTH DAILY 90 tablet 3   No current facility-administered medications for this visit.    Allergies: Penicillins  Past Medical History:  Diagnosis Date  . Chicken pox   . Colon polyps   . Hyperlipidemia   . Temporal arteritis (HCC)   . Thyroid disorder     Past Surgical History:  Procedure Laterality Date  . ANKLE SURGERY    . CATARACT EXTRACTION    . TONSILLECTOMY AND ADENOIDECTOMY    . TUBAL LIGATION      Family History  Problem Relation Age of Onset  . Early death Father   . Cancer Sister   . Hyperlipidemia Brother   . Diabetes Maternal Grandmother   . Diabetes Maternal Grandfather     Social History   Tobacco Use  . Smoking status: Former Games developer  . Smokeless tobacco: Never Used  Substance Use Topics  . Alcohol use: Yes    Comment:  rarely    Subjective:   I connected with Julie Richard on 07/17/20 at  9:40 AM EST by a video enabled telemedicine application and verified that I am speaking with the correct person using two identifiers.   I discussed the limitations of evaluation and management by telemedicine and the availability of in person appointments. The patient expressed understanding and agreed to proceed. Provider in office/ patient is at home; provider and patient are only 2 people on video call.   Complaining of urinary frequency/ incomplete emptying; symptoms present "x months." No blood in urine; no incontinence;   Worried that she may have cracked another rib earlier this week; concerned about effectiveness of Prolia if this is the case; 4 fractures in the past 6 months; history of temporal arteritis and took prednisone for extended period of time- as a result, osteoporosis was managed by rheumatologist in the past; would be open to going back to rheumatology;  Of note, patient's husband has moved to memory care in the past week; adjusting to this change;     Objective:  There were no vitals filed for this visit.  General: Well developed, well nourished, in no acute distress  Skin : Warm and dry.  Head: Normocephalic and atraumatic  Lungs: Respirations unlabored; clear to auscultation  bilaterally without wheeze, rales, rhonchi  Neurologic: Alert and oriented; speech intact; face symmetrical; moves all extremities well; CNII-XII intact without focal deficit   Assessment:  1. Urinary frequency   2. Incomplete emptying of bladder   3. Rib pain on left side   4. Osteoporosis, unspecified osteoporosis type, unspecified pathological fracture presence     Plan:  1. Check U/A and urine culture; may need to consider Myrbetriq if no infection noted; 3. Update rib X-ray; 4. Agree with concerns about her response to Prolia with numerous fractures; refer to rheumatologist to discuss other options;   Time  spent 30 minutes  No follow-ups on file.  Orders Placed This Encounter  Procedures  . Urine Culture    Standing Status:   Future    Number of Occurrences:   1    Standing Expiration Date:   07/17/2021  . DG Ribs Unilateral Left    Standing Status:   Future    Number of Occurrences:   1    Standing Expiration Date:   07/17/2021    Order Specific Question:   Reason for Exam (SYMPTOM  OR DIAGNOSIS REQUIRED)    Answer:   left rib pain    Order Specific Question:   Preferred imaging location?    Answer:   [facility]  . Urinalysis    Standing Status:   Future    Number of Occurrences:   1    Standing Expiration Date:   07/17/2021  . Ambulatory referral to Rheumatology    Referral Priority:   Routine    Referral Type:   Consultation    Referral Reason:   Specialty Services Required    Requested Specialty:   Rheumatology    Number of Visits Requested:   1    Requested Prescriptions    No prescriptions requested or ordered in this encounter

## 2020-07-18 LAB — URINE CULTURE
MICRO NUMBER:: 11404786
SPECIMEN QUALITY:: ADEQUATE

## 2020-07-19 ENCOUNTER — Other Ambulatory Visit: Payer: Self-pay | Admitting: Family

## 2020-07-19 ENCOUNTER — Encounter: Payer: Self-pay | Admitting: Family

## 2020-07-19 MED ORDER — MIRABEGRON ER 25 MG PO TB24: 25.0000 mg | ORAL_TABLET | Freq: Every day | ORAL | 1 refills | Status: DC

## 2020-08-01 ENCOUNTER — Other Ambulatory Visit: Payer: Self-pay

## 2020-08-01 ENCOUNTER — Encounter: Payer: Self-pay | Admitting: Internal Medicine

## 2020-08-01 ENCOUNTER — Ambulatory Visit (INDEPENDENT_AMBULATORY_CARE_PROVIDER_SITE_OTHER): Payer: Medicare Other | Admitting: Internal Medicine

## 2020-08-01 DIAGNOSIS — M818 Other osteoporosis without current pathological fracture: Secondary | ICD-10-CM

## 2020-08-01 DIAGNOSIS — F418 Other specified anxiety disorders: Secondary | ICD-10-CM

## 2020-08-01 DIAGNOSIS — N3281 Overactive bladder: Secondary | ICD-10-CM

## 2020-08-01 DIAGNOSIS — T452X1S Poisoning by vitamins, accidental (unintentional), sequela: Secondary | ICD-10-CM | POA: Diagnosis not present

## 2020-08-01 DIAGNOSIS — E785 Hyperlipidemia, unspecified: Secondary | ICD-10-CM | POA: Diagnosis not present

## 2020-08-01 MED ORDER — ESCITALOPRAM OXALATE 5 MG PO TABS
5.0000 mg | ORAL_TABLET | Freq: Every day | ORAL | 3 refills | Status: DC
Start: 2020-08-01 — End: 2020-08-16

## 2020-08-01 MED ORDER — TOLTERODINE TARTRATE ER 4 MG PO CP24: 4.0000 mg | ORAL_CAPSULE | Freq: Every day | ORAL | 11 refills | Status: DC

## 2020-08-01 MED ORDER — TOLTERODINE TARTRATE ER 4 MG PO CP24
4.0000 mg | ORAL_CAPSULE | Freq: Every day | ORAL | 3 refills | Status: DC
Start: 2020-08-01 — End: 2021-07-15

## 2020-08-01 MED ORDER — MELOXICAM 15 MG PO TABS: ORAL_TABLET | ORAL | 0 refills | Status: DC

## 2020-08-01 NOTE — Patient Instructions (Signed)
Chair yoga °

## 2020-08-01 NOTE — Assessment & Plan Note (Signed)
-   Crestor 

## 2020-08-01 NOTE — Assessment & Plan Note (Signed)
Worse Husband is in Memory Care at [facility] since Jan 3d - stress We can increase Lexapro

## 2020-08-01 NOTE — Progress Notes (Signed)
Subjective:  Patient ID: Julie Richard, female    DOB: 1946-07-07  Age: 75 y.o. MRN: [id number]  CC: 3 mon f/u (She would like to discuss a alternative for Myrbetriq, she did not fill this medication due to being $400 out of pocket. She would like to discuss getting a new prescription for her Lexapro and Meloxicam)   HPI Donabelle Molden presents for osteoporosis. Husband is in Memory Care at [facility] since Jan 3d - stress C/o frequency   Outpatient Medications Prior to Visit  Medication Sig Dispense Refill  . aspirin EC 81 MG tablet Take 1 tablet (81 mg total) by mouth daily. 100 tablet 3  . baclofen (LIORESAL) 10 MG tablet Take 10 mg by mouth as needed.    Marland Kitchen CALCIUM PO Take by mouth.    . Cholecalciferol 50 MCG (2000 UT) TABS 1000 iu 3/week - high levels in the past 100 tablet 3  . denosumab (PROLIA) 60 MG/ML SOSY injection Inject 60 mg into the skin every 6 (six) months.    . escitalopram (LEXAPRO) 5 MG tablet Take 1 tablet (5 mg total) by mouth daily. 30 tablet 5  . meloxicam (MOBIC) 15 MG tablet TAKE 1 TABLET(15 MG) BY MOUTH DAILY 90 tablet 0  . Multiple Vitamin (MULTI-VITAMIN) tablet Take by mouth.    . rosuvastatin (CRESTOR) 10 MG tablet TAKE 1 TABLET(10 MG) BY MOUTH DAILY 90 tablet 3  . mirabegron ER (MYRBETRIQ) 25 MG TB24 tablet Take 1 tablet (25 mg total) by mouth daily. (Patient not taking: Reported on 08/01/2020) 30 tablet 1   No facility-administered medications prior to visit.    ROS: Review of Systems  Constitutional: Negative for activity change, appetite change, chills, fatigue and unexpected weight change.  HENT: Negative for congestion, mouth sores and sinus pressure.   Eyes: Negative for visual disturbance.  Respiratory: Negative for cough and chest tightness.   Gastrointestinal: Negative for abdominal pain and nausea.  Genitourinary: Positive for frequency and urgency. Negative for difficulty urinating and vaginal pain.  Musculoskeletal: Negative for  back pain and gait problem.  Skin: Negative for pallor and rash.  Neurological: Negative for dizziness, tremors, weakness, numbness and headaches.  Psychiatric/Behavioral: Negative for confusion and sleep disturbance.    Objective:  BP 118/68   Pulse 61   Temp 98.6 F (37 C) (Oral)   Ht 5\' 1"  (1.549 m)   Wt 104 lb 6.4 oz (47.4 kg)   SpO2 99%   BMI 19.73 kg/m   BP Readings from Last 3 Encounters:  08/01/20 118/68  05/23/20 110/60  05/11/20 (!) 122/58    Wt Readings from Last 3 Encounters:  08/01/20 104 lb 6.4 oz (47.4 kg)  05/23/20 102 lb 9.6 oz (46.5 kg)  05/11/20 104 lb (47.2 kg)    Physical Exam Constitutional:      General: She is not in acute distress.    Appearance: She is well-developed.  HENT:     Head: Normocephalic.     Right Ear: External ear normal.     Left Ear: External ear normal.     Nose: Nose normal.     Mouth/Throat:     Mouth: Oropharynx is clear and moist.  Eyes:     General:        Right eye: No discharge.        Left eye: No discharge.     Conjunctiva/sclera: Conjunctivae normal.     Pupils: Pupils are equal, round, and reactive to light.  Neck:     Thyroid: No thyromegaly.     Vascular: No JVD.     Trachea: No tracheal deviation.  Cardiovascular:     Rate and Rhythm: Normal rate and regular rhythm.     Heart sounds: Normal heart sounds.  Pulmonary:     Effort: No respiratory distress.     Breath sounds: No stridor. No wheezing.  Abdominal:     General: Bowel sounds are normal. There is no distension.     Palpations: Abdomen is soft. There is no mass.     Tenderness: There is no abdominal tenderness. There is no guarding or rebound.  Musculoskeletal:        General: No tenderness or edema.     Cervical back: Normal range of motion and neck supple.  Lymphadenopathy:     Cervical: No cervical adenopathy.  Skin:    Findings: No erythema or rash.  Neurological:     Mental Status: She is oriented to person, place, and time.      Cranial Nerves: No cranial nerve deficit.     Motor: No abnormal muscle tone.     Coordination: Coordination normal.     Deep Tendon Reflexes: Reflexes normal.  Psychiatric:        Mood and Affect: Mood and affect normal.        Behavior: Behavior normal.        Thought Content: Thought content normal.        Judgment: Judgment normal.    Thin  Lab Results  Component Value Date   WBC 6.7 05/03/2020   HGB 12.6 05/03/2020   HCT 37.0 05/03/2020   PLT 191 05/03/2020   GLUCOSE 109 (H) 05/03/2020   CHOL 138 06/21/2019   TRIG 68.0 06/21/2019   HDL 50.70 06/21/2019   LDLCALC 74 06/21/2019   ALT 16 04/11/2020   AST 21 04/11/2020   NA 140 05/03/2020   K 3.9 05/03/2020   CL 103 05/03/2020   CREATININE 0.51 05/03/2020   BUN 17 05/03/2020   CO2 27 05/03/2020   TSH 2.22 06/21/2019    DG Ribs Unilateral Left  Result Date: 07/17/2020 CLINICAL DATA:  75 year old female with a history of left-sided rib pain EXAM: LEFT RIBS - 2 VIEW COMPARISON:  None. FINDINGS: Cardiomediastinal silhouette within normal limits. Osteopenia. Irregularity of the left fifth rib on the AP view, similar location to the abnormality identified on prior plain film. No acute displaced fracture identified. Scoliotic curvature unchanged. IMPRESSION: No acute displaced rib fracture identified. Electronically Signed   By: Gilmer Mor D.O.   On: 07/17/2020 16:54    Assessment & Plan:    Sonda Primes, MD

## 2020-08-01 NOTE — Assessment & Plan Note (Signed)
Vit D 3/wk

## 2020-08-01 NOTE — Assessment & Plan Note (Signed)
Vit D 3/wk 

## 2020-08-01 NOTE — Assessment & Plan Note (Signed)
Myrbetriq was $400 Try Detrol LA

## 2020-08-16 ENCOUNTER — Other Ambulatory Visit: Payer: Self-pay | Admitting: Internal Medicine

## 2020-08-27 ENCOUNTER — Ambulatory Visit: Payer: Medicare Other | Admitting: Internal Medicine

## 2020-10-12 ENCOUNTER — Other Ambulatory Visit: Payer: Self-pay

## 2020-10-12 ENCOUNTER — Ambulatory Visit (INDEPENDENT_AMBULATORY_CARE_PROVIDER_SITE_OTHER): Payer: Medicare Other

## 2020-10-12 DIAGNOSIS — M818 Other osteoporosis without current pathological fracture: Secondary | ICD-10-CM

## 2020-10-12 MED ORDER — DENOSUMAB 60 MG/ML ~~LOC~~ SOSY
60.0000 mg | PREFILLED_SYRINGE | Freq: Once | SUBCUTANEOUS | Status: AC
  Administered 2020-10-12: 60 mg via SUBCUTANEOUS

## 2020-10-12 NOTE — Progress Notes (Signed)
Pt here for Prolia injection per Dr. Posey Rea  Prolia given Sub Q right arm, and pt tolerated injection well.  Next Prolia injection scheduled for 04/15/21.

## 2020-10-15 ENCOUNTER — Ambulatory Visit: Payer: Medicare Other

## 2020-10-19 ENCOUNTER — Other Ambulatory Visit: Payer: Self-pay

## 2020-10-19 ENCOUNTER — Ambulatory Visit: Payer: Medicare Other | Attending: Internal Medicine

## 2020-10-19 DIAGNOSIS — Z23 Encounter for immunization: Secondary | ICD-10-CM

## 2020-10-19 NOTE — Progress Notes (Signed)
   Covid-19 Vaccination Clinic  Name:  Julie Richard    MRN: [id number] DOB: 07-28-1945  10/19/2020  [name] was observed post Covid-19 immunization for 15 minutes without incident. She was provided with Vaccine Information Sheet and instruction to access the V-Safe system.   [name] was instructed to call 911 with any severe reactions post vaccine: Marland Kitchen Difficulty breathing  . Swelling of face and throat  . A fast heartbeat  . A bad rash all over body  . Dizziness and weakness   Immunizations Administered    Name Date Dose VIS Date Route   PFIZER Comrnaty(Gray TOP) Covid-19 Vaccine 10/19/2020  9:21 AM 0.3 mL 06/14/2020 Intramuscular   Manufacturer: [employer], [employer]   Lot: [id number]   NDC: [phone]

## 2020-10-22 ENCOUNTER — Other Ambulatory Visit (HOSPITAL_BASED_OUTPATIENT_CLINIC_OR_DEPARTMENT_OTHER): Payer: Self-pay

## 2020-10-22 MED ORDER — COVID-19 MRNA VACCINE (PFIZER) 30 MCG/0.3ML IM SUSP
INTRAMUSCULAR | 0 refills | Status: DC
  Filled 2020-10-22: qty 0.3, 1d supply, fill #0

## 2020-10-24 ENCOUNTER — Ambulatory Visit (INDEPENDENT_AMBULATORY_CARE_PROVIDER_SITE_OTHER): Payer: Medicare Other | Admitting: Internal Medicine

## 2020-10-24 ENCOUNTER — Other Ambulatory Visit: Payer: Self-pay

## 2020-10-24 ENCOUNTER — Encounter: Payer: Self-pay | Admitting: Internal Medicine

## 2020-10-24 VITALS — BP 122/68 | HR 70 | Temp 98.0°F | Resp 18 | Ht 61.0 in | Wt 106.0 lb

## 2020-10-24 DIAGNOSIS — I2583 Coronary atherosclerosis due to lipid rich plaque: Secondary | ICD-10-CM

## 2020-10-24 DIAGNOSIS — I2510 Atherosclerotic heart disease of native coronary artery without angina pectoris: Secondary | ICD-10-CM | POA: Diagnosis not present

## 2020-10-24 DIAGNOSIS — N6489 Other specified disorders of breast: Secondary | ICD-10-CM | POA: Diagnosis not present

## 2020-10-24 DIAGNOSIS — N63 Unspecified lump in unspecified breast: Secondary | ICD-10-CM | POA: Diagnosis not present

## 2020-10-24 DIAGNOSIS — R21 Rash and other nonspecific skin eruption: Secondary | ICD-10-CM | POA: Insufficient documentation

## 2020-10-24 MED ORDER — TRIAMCINOLONE ACETONIDE 0.5 % EX CREA: 1.0000 "application " | TOPICAL_CREAM | Freq: Four times a day (QID) | CUTANEOUS | 1 refills | Status: AC

## 2020-10-24 NOTE — Assessment & Plan Note (Addendum)
?  allergic Triamc cream prn Loratdine

## 2020-10-24 NOTE — Assessment & Plan Note (Addendum)
R breast - an oval mass at 11 o'clock at the periphery of the breast 20x12 mm   Diagnostic mammogram

## 2020-10-24 NOTE — Assessment & Plan Note (Signed)
On Crestor 

## 2020-10-24 NOTE — Progress Notes (Signed)
Subjective:  Patient ID: Julie Richard, female    DOB: 06-03-1946  Age: 75 y.o. MRN: [id number]  CC: Rash (Stomach. Noticed it a few weeks ago. Denies using any new laundry detergent or body wash. ) and Breast Mass (Right breast, she noticed it 2-3 weeks ago. No pain. )   HPI Julie Richard presents for rash in LLQ itchy for 2 wks - pt used a steroid cream  C/o breast lump on the R - found 2 wks ago  Outpatient Medications Prior to Visit  Medication Sig Dispense Refill  . aspirin EC 81 MG tablet Take 1 tablet (81 mg total) by mouth daily. 100 tablet 3  . baclofen (LIORESAL) 10 MG tablet Take 10 mg by mouth as needed.    Marland Kitchen CALCIUM PO Take by mouth.    . Cholecalciferol 50 MCG (2000 UT) TABS 1000 iu 3/week - high levels in the past 100 tablet 3  . COVID-19 mRNA vaccine, Pfizer, 30 MCG/0.3ML injection Inject into the muscle. 0.3 mL 0  . denosumab (PROLIA) 60 MG/ML SOSY injection Inject 60 mg into the skin every 6 (six) months.    . escitalopram (LEXAPRO) 5 MG tablet TAKE 1 TABLET(5 MG) BY MOUTH DAILY (Patient taking differently: every other day.) 30 tablet 5  . meloxicam (MOBIC) 15 MG tablet TAKE 0.5-1 TABLET BY MOUTH DAILY PC PRN 90 tablet 0  . Multiple Vitamin (MULTI-VITAMIN) tablet Take by mouth.    . rosuvastatin (CRESTOR) 10 MG tablet TAKE 1 TABLET(10 MG) BY MOUTH DAILY 90 tablet 3  . tolterodine (DETROL LA) 4 MG 24 hr capsule Take 1 capsule (4 mg total) by mouth daily. 90 capsule 3   No facility-administered medications prior to visit.    ROS: Review of Systems  Constitutional: Negative for activity change, appetite change, chills, fatigue and unexpected weight change.  HENT: Negative for congestion, mouth sores and sinus pressure.   Eyes: Negative for visual disturbance.  Respiratory: Negative for cough and chest tightness.   Gastrointestinal: Negative for abdominal pain and nausea.  Genitourinary: Negative for difficulty urinating, frequency and vaginal pain.   Musculoskeletal: Negative for back pain and gait problem.  Skin: Positive for rash. Negative for pallor.  Neurological: Negative for dizziness, tremors, weakness, numbness and headaches.  Psychiatric/Behavioral: Negative for confusion and sleep disturbance.    Objective:  BP 122/68   Pulse 70   Temp 98 F (36.7 C) (Oral)   Resp 18   Ht 5\' 1"  (1.549 m)   Wt 106 lb (48.1 kg)   SpO2 98%   BMI 20.03 kg/m   BP Readings from Last 3 Encounters:  10/24/20 122/68  08/01/20 118/68  05/23/20 110/60    Wt Readings from Last 3 Encounters:  10/24/20 106 lb (48.1 kg)  08/01/20 104 lb 6.4 oz (47.4 kg)  05/23/20 102 lb 9.6 oz (46.5 kg)    Physical Exam Constitutional:      General: She is not in acute distress.    Appearance: She is well-developed.  HENT:     Head: Normocephalic.     Right Ear: External ear normal.     Left Ear: External ear normal.     Nose: Nose normal.  Eyes:     General:        Right eye: No discharge.        Left eye: No discharge.     Conjunctiva/sclera: Conjunctivae normal.     Pupils: Pupils are equal, round, and reactive to light.  Neck:     Thyroid: No thyromegaly.     Vascular: No JVD.     Trachea: No tracheal deviation.  Cardiovascular:     Rate and Rhythm: Normal rate and regular rhythm.     Heart sounds: Normal heart sounds.  Pulmonary:     Effort: No respiratory distress.     Breath sounds: No stridor. No wheezing.  Abdominal:     General: Bowel sounds are normal. There is no distension.     Palpations: Abdomen is soft. There is no mass.     Tenderness: There is no abdominal tenderness. There is no guarding or rebound.  Musculoskeletal:        General: No tenderness.     Cervical back: Normal range of motion and neck supple.  Lymphadenopathy:     Cervical: No cervical adenopathy.  Skin:    Findings: Rash present. No erythema.  Neurological:     Mental Status: She is oriented to person, place, and time.     Cranial Nerves: No cranial  nerve deficit.     Motor: No abnormal muscle tone.     Coordination: Coordination normal.     Deep Tendon Reflexes: Reflexes normal.  Psychiatric:        Behavior: Behavior normal.        Thought Content: Thought content normal.        Judgment: Judgment normal.   L breast nl R breast - an oval mass 20x12 mm at 11 o'clock at the periphery of the breast  Lab Results  Component Value Date   WBC 6.7 05/03/2020   HGB 12.6 05/03/2020   HCT 37.0 05/03/2020   PLT 191 05/03/2020   GLUCOSE 109 (H) 05/03/2020   CHOL 138 06/21/2019   TRIG 68.0 06/21/2019   HDL 50.70 06/21/2019   LDLCALC 74 06/21/2019   ALT 16 04/11/2020   AST 21 04/11/2020   NA 140 05/03/2020   K 3.9 05/03/2020   CL 103 05/03/2020   CREATININE 0.51 05/03/2020   BUN 17 05/03/2020   CO2 27 05/03/2020   TSH 2.22 06/21/2019    DG Ribs Unilateral Left  Result Date: 07/17/2020 CLINICAL DATA:  75 year old female with a history of left-sided rib pain EXAM: LEFT RIBS - 2 VIEW COMPARISON:  None. FINDINGS: Cardiomediastinal silhouette within normal limits. Osteopenia. Irregularity of the left fifth rib on the AP view, similar location to the abnormality identified on prior plain film. No acute displaced fracture identified. Scoliotic curvature unchanged. IMPRESSION: No acute displaced rib fracture identified. Electronically Signed   By: Gilmer Mor D.O.   On: 07/17/2020 16:54    Assessment & Plan:     Follow-up: No follow-ups on file.  Sonda Primes, MD

## 2020-11-21 ENCOUNTER — Other Ambulatory Visit: Payer: Self-pay | Admitting: Internal Medicine

## 2020-11-21 DIAGNOSIS — N63 Unspecified lump in unspecified breast: Secondary | ICD-10-CM

## 2020-11-21 DIAGNOSIS — N6489 Other specified disorders of breast: Secondary | ICD-10-CM

## 2020-11-24 ENCOUNTER — Ambulatory Visit
Admission: RE | Admit: 2020-11-24 | Discharge: 2020-11-24 | Disposition: A | Discharge status: Home / Self Care | Payer: Medicare Other | Source: Ambulatory Visit | Attending: Internal Medicine | Admitting: Internal Medicine

## 2020-11-24 ENCOUNTER — Other Ambulatory Visit: Payer: Self-pay

## 2020-11-24 DIAGNOSIS — N63 Unspecified lump in unspecified breast: Secondary | ICD-10-CM

## 2020-11-24 DIAGNOSIS — N6489 Other specified disorders of breast: Secondary | ICD-10-CM

## 2020-11-26 ENCOUNTER — Other Ambulatory Visit: Payer: Self-pay | Admitting: Internal Medicine

## 2020-12-04 ENCOUNTER — Other Ambulatory Visit: Payer: Self-pay | Admitting: Internal Medicine

## 2020-12-04 DIAGNOSIS — Z1231 Encounter for screening mammogram for malignant neoplasm of breast: Secondary | ICD-10-CM

## 2020-12-31 LAB — HM DIABETES EYE EXAM

## 2021-01-14 ENCOUNTER — Encounter: Payer: Self-pay | Admitting: Internal Medicine

## 2021-01-14 ENCOUNTER — Other Ambulatory Visit: Payer: Self-pay | Admitting: Internal Medicine

## 2021-01-30 ENCOUNTER — Ambulatory Visit: Payer: Medicare Other

## 2021-01-31 ENCOUNTER — Emergency Department (HOSPITAL_BASED_OUTPATIENT_CLINIC_OR_DEPARTMENT_OTHER)
Admission: EM | Admit: 2021-01-31 | Discharge: 2021-01-31 | Disposition: A | Discharge status: Home / Self Care | Payer: Medicare Other | Attending: Emergency Medicine | Admitting: Emergency Medicine

## 2021-01-31 ENCOUNTER — Other Ambulatory Visit: Payer: Self-pay

## 2021-01-31 ENCOUNTER — Encounter (HOSPITAL_BASED_OUTPATIENT_CLINIC_OR_DEPARTMENT_OTHER): Payer: Self-pay

## 2021-01-31 ENCOUNTER — Emergency Department (HOSPITAL_BASED_OUTPATIENT_CLINIC_OR_DEPARTMENT_OTHER): Payer: Medicare Other

## 2021-01-31 DIAGNOSIS — Z87891 Personal history of nicotine dependence: Secondary | ICD-10-CM | POA: Insufficient documentation

## 2021-01-31 DIAGNOSIS — M542 Cervicalgia: Secondary | ICD-10-CM | POA: Diagnosis present

## 2021-01-31 MED ORDER — OXYCODONE-ACETAMINOPHEN 5-325 MG PO TABS: 1.0000 | ORAL_TABLET | Freq: Three times a day (TID) | ORAL | 0 refills | Status: AC | PRN

## 2021-01-31 MED ORDER — ACETAMINOPHEN 325 MG PO TABS
650.0000 mg | ORAL_TABLET | Freq: Once | ORAL | Status: AC
  Administered 2021-01-31: 650 mg via ORAL
  Filled 2021-01-31: qty 2

## 2021-01-31 MED ORDER — IBUPROFEN 400 MG PO TABS
400.0000 mg | ORAL_TABLET | Freq: Once | ORAL | Status: AC
  Administered 2021-01-31: 400 mg via ORAL
  Filled 2021-01-31: qty 1

## 2021-01-31 NOTE — ED Provider Notes (Signed)
[facility] EMERGENCY DEPARTMENT Provider Note   CSN: [id number] Arrival date & time: 01/31/21  0850     History Chief Complaint  Patient presents with   Neck Pain    Julie Richard is a 75 y.o. female.  Patient complaining of bilateral posterior neck pain for 5 to 6 days now.  She states that she woke up and felt neck pain about 5 to 6 days ago and the pain is not gone away.  Worse when she tries to turn her head certain ways.  Denies any fall or trauma.  Denies any fevers or cough or vomiting or diarrhea.  Denies any new numbness or weakness.  She tried to see her rheumatologist but has an appointment for next week.  Has been taking 325 mg of Tylenol for this pain without improvement.      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   Coronary atherosclerosis 02/23/2020   Temporal arteritis (HCC) 06/21/2019   Osteoporosis 06/21/2019   Osteoarthritis 06/21/2019   Scoliosis 06/21/2019   Dyslipidemia 06/21/2019    Past Surgical History:  Procedure Laterality Date   ANKLE SURGERY     CATARACT EXTRACTION     TONSILLECTOMY AND ADENOIDECTOMY     TUBAL LIGATION       OB History     Gravida  2   Para  2   Term      Preterm      AB      Living         SAB      IAB      Ectopic      Multiple      Live Births              Family History  Problem Relation Age of Onset   Early death Father    Cancer Sister    Hyperlipidemia Brother    Diabetes Maternal Grandmother    Diabetes Maternal Grandfather     Social History   Tobacco Use   Smoking status: Former   Smokeless tobacco: Never  Substance Use Topics    Alcohol use: Yes    Comment: rarely   Drug use: Never    Home Medications Prior to Admission medications   Medication Sig Start Date End Date Taking? Authorizing Provider  oxyCODONE-acetaminophen (PERCOCET/ROXICET) 5-325 MG tablet Take 1 tablet by mouth every 8 (eight) hours as needed for up to 5 doses for severe pain. 01/31/21  Yes Cheryll Cockayne, MD  baclofen (LIORESAL) 10 MG tablet Take 10 mg by mouth as needed. 05/02/19   [provider]  CALCIUM PO Take by mouth.    [provider]  Cholecalciferol 50 MCG (2000 UT) TABS 1000 iu 3/week - high levels in the past 10/18/19   Plotnikov, Georgina Quint, MD  COVID-19 mRNA vaccine, Pfizer, 30 MCG/0.3ML injection Inject into the muscle. 10/19/20   Judyann Munson, MD  denosumab (PROLIA) 60 MG/ML SOSY injection Inject 60 mg into the skin every 6 (six) months.    [provider]  escitalopram (LEXAPRO) 5 MG tablet TAKE 1 TABLET(5  MG) BY MOUTH DAILY Patient taking differently: every other day. 08/16/20   Plotnikov, Georgina Quint, MD  meloxicam (MOBIC) 15 MG tablet TAKE 1/2 TO 1 TABLET BY MOUTH DAILY AFTER A MEAL AS NEEDED 11/26/20   Plotnikov, Georgina Quint, MD  Multiple Vitamin (MULTI-VITAMIN) tablet Take by mouth.    [provider]  rosuvastatin (CRESTOR) 10 MG tablet TAKE 1 TABLET(10 MG) BY MOUTH DAILY Keep follow-up appt for future refills 01/14/21   Plotnikov, Georgina Quint, MD  tolterodine (DETROL LA) 4 MG 24 hr capsule Take 1 capsule (4 mg total) by mouth daily. 08/01/20   Plotnikov, Georgina Quint, MD  triamcinolone cream (KENALOG) 0.5 % Apply 1 application topically 4 (four) times daily. 10/24/20 10/24/21  Plotnikov, Georgina Quint, MD    Allergies    Penicillins  Review of Systems   Review of Systems  Constitutional:  Negative for fever.  HENT:  Negative for ear pain.   Eyes:  Negative for pain.  Respiratory:  Negative for cough.   Cardiovascular:  Negative for chest pain.  Gastrointestinal:  Negative for abdominal pain.   Genitourinary:  Negative for flank pain.  Musculoskeletal:  Positive for neck pain. Negative for back pain.  Skin:  Negative for rash.  Neurological:  Negative for headaches.   Physical Exam Updated Vital Signs BP (!) 157/71 (BP Location: Right Arm)   Pulse 86   Temp 98.1 F (36.7 C) (Oral)   Resp 20   Ht 5\' 1"  (1.549 m)   Wt 47.6 kg   SpO2 100%   BMI 19.84 kg/m   Physical Exam Constitutional:      General: She is not in acute distress.    Appearance: Normal appearance.  HENT:     Head: Normocephalic.     Nose: Nose normal.  Eyes:     Extraocular Movements: Extraocular movements intact.  Neck:     Comments: Decreased turning of the neck and flexion extension of the neck secondary to pain.  Mild tenderness along C3-4-5 bilateral paraspinal regions. Cardiovascular:     Rate and Rhythm: Normal rate.  Pulmonary:     Effort: Pulmonary effort is normal.  Musculoskeletal:        General: Normal range of motion.  Neurological:     General: No focal deficit present.     Mental Status: She is alert and oriented to person, place, and time. Mental status is at baseline.     Cranial Nerves: No cranial nerve deficit.     Motor: No weakness.     Gait: Gait normal.    ED Results / Procedures / Treatments   Labs (all labs ordered are listed, but only abnormal results are displayed) Labs Reviewed - No data to display  EKG None  Radiology DG Cervical Spine Complete  Result Date: 01/31/2021 CLINICAL DATA:  75 year old female with lower neck pain for 5 days. Pain radiating to the base of the head. Painful range of motion. EXAM: CERVICAL SPINE - COMPLETE 4+ VIEW COMPARISON:  None. FINDINGS: Normal prevertebral soft tissue contour. Mild straightening of cervical lordosis. Subtle anterolisthesis of C7 on T1 appears to be degenerative, with multilevel cervical facet hypertrophy but at least moderate and most pronounced at the cervicothoracic junction. Bilateral posterior element  alignment is within normal limits. Normal C1-C2 alignment. Possible left C1-C2 joint space loss. No acute osseous abnormality identified. Moderate to severe disc space loss with endplate spurring 61 through C5-C6. Negative visible upper chest. IMPRESSION: 1. No acute osseous abnormality identified in the  cervical spine. 2. Mild degenerative anterolisthesis at C7-T1 with at least moderate associated facet arthropathy. 3. 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 Electronically Signed   By: Odessa Fleming M.D.   On: 01/31/2021 09:45    Procedures Procedures   Medications Ordered in ED Medications  ibuprofen (ADVIL) tablet 400 mg (400 mg Oral Given 01/31/21 0927)  acetaminophen (TYLENOL) tablet 650 mg (650 mg Oral Given 01/31/21 9983)    ED Course  I have reviewed the triage vital signs and the nursing notes.  Pertinent labs & imaging results that were available during my care of the patient were reviewed by me and considered in my medical decision making (see chart for details).    MDM Rules/Calculators/A&P                           Patient presented with neck stiffness and pain.  No reports of headache or fevers.  No reports of trauma.  Imaging shows advanced degenerative disease.  Patient given Tylenol and Motrin here in the ER.  She is inquiring about whether this to be adequate at home, will advise half a tablet of Percocet as needed for breakthrough pain at home.  Recommending immediate return for worsening symptoms or any additional concerns.  Final Clinical Impression(s) / ED Diagnoses Final diagnoses:  Neck pain    Rx / DC Orders ED Discharge Orders          Ordered    oxyCODONE-acetaminophen (PERCOCET/ROXICET) 5-325 MG tablet  Every 8 hours PRN        01/31/21 0953             Cheryll Cockayne, MD 01/31/21 [phone]

## 2021-01-31 NOTE — Discharge Instructions (Addendum)
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  Take Tylenol 650 mg as needed for pain up to every 4 hours. If your pain becomes more severe you may take 1 Percocet tablet.  Follow-up with your doctor/specialist within the week. Return to the ER if you have new numbness weakness or worsening symptoms.

## 2021-01-31 NOTE — ED Notes (Signed)
Pt transported to imaging.

## 2021-01-31 NOTE — ED Triage Notes (Signed)
Pt states pain in lower neck since Saturday, has now spread up to base of her head. Pt states that she tried to get in with rheumatologist, but unable to until next Wednesday.

## 2021-02-06 ENCOUNTER — Other Ambulatory Visit: Payer: Self-pay

## 2021-02-06 ENCOUNTER — Ambulatory Visit (INDEPENDENT_AMBULATORY_CARE_PROVIDER_SITE_OTHER): Payer: Medicare Other | Admitting: Internal Medicine

## 2021-02-06 ENCOUNTER — Encounter: Payer: Self-pay | Admitting: Internal Medicine

## 2021-02-06 VITALS — BP 112/71 | HR 76 | Ht 61.25 in | Wt 107.2 lb

## 2021-02-06 DIAGNOSIS — M818 Other osteoporosis without current pathological fracture: Secondary | ICD-10-CM | POA: Diagnosis not present

## 2021-02-06 DIAGNOSIS — M316 Other giant cell arteritis: Secondary | ICD-10-CM | POA: Diagnosis not present

## 2021-02-06 DIAGNOSIS — E559 Vitamin D deficiency, unspecified: Secondary | ICD-10-CM | POA: Diagnosis not present

## 2021-02-06 DIAGNOSIS — E785 Hyperlipidemia, unspecified: Secondary | ICD-10-CM | POA: Insufficient documentation

## 2021-02-06 DIAGNOSIS — Z79899 Other long term (current) drug therapy: Secondary | ICD-10-CM

## 2021-02-06 DIAGNOSIS — E05 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: Secondary | ICD-10-CM | POA: Insufficient documentation

## 2021-02-06 NOTE — Progress Notes (Signed)
  Activities of Daily Living:  Patient reports morning stiffness for 0 minutes.   Patient Denies nocturnal pain.  Difficulty dressing/grooming: Denies Difficulty climbing stairs: Denies Difficulty getting out of chair: Denies Difficulty using hands for taps, buttons, cutlery, and/or writing: Denies  Review of Systems  Constitutional:  Negative for fatigue.  HENT:  Negative for mouth sores, mouth dryness and nose dryness.   Eyes:  Positive for dryness. Negative for pain, itching and visual disturbance.  Respiratory:  Negative for cough, hemoptysis, shortness of breath and difficulty breathing.   Cardiovascular:  Negative for chest pain, palpitations and swelling in legs/feet.  Gastrointestinal:  Negative for abdominal pain, blood in stool, constipation and diarrhea.  Endocrine: Negative for increased urination.  Genitourinary:  Negative for painful urination.  Musculoskeletal:  Positive for joint pain, joint pain, joint swelling, myalgias, muscle tenderness and myalgias. Negative for muscle weakness and morning stiffness.  Skin:  Negative for color change, rash and redness.  Allergic/Immunologic: Negative for susceptible to infections.  Neurological:  Negative for dizziness, numbness, headaches, memory loss and weakness.  Hematological:  Negative for swollen glands.  Psychiatric/Behavioral:  Negative for confusion and sleep disturbance.

## 2021-02-06 NOTE — Patient Instructions (Signed)
Erythrocyte Sedimentation Rate Test Why am I having this test? The erythrocyte sedimentation rate (ESR) test is used to help find illnesses related to: Sudden (acute) or long-term (chronic) infections. Inflammation. The body's disease-fighting system attacking healthy cells (autoimmune diseases). Cancer. Tissue death. If you have symptoms that may be related to any of these illnesses, your health care provider may do an ESR test before doing more specific tests. If you have an inflammatory immune disease, such as rheumatoid arthritis, you may have thistest to help monitor your therapy. What is being tested? This test measures how long it takes for your red blood cells (erythrocytes) to settle in a solution over a certain amount of time (sedimentation rate). When you have an infection or inflammation, your red blood cells clump together and settle faster. The sedimentation rate provides information abouthow much inflammation is present in the body. What kind of sample is taken?  A blood sample is required for this test. It is usually collected by insertinga needle into a blood vessel. How do I prepare for this test? Follow any instructions from your health care provider about changing orstopping your regular medicines. Tell a health care provider about: Any allergies you have. All medicines you are taking, including vitamins, herbs, eye drops, creams, and over-the-counter medicines. Any blood disorders you have. Any surgeries you have had. Any medical conditions you have, such as thyroid or kidney disease. Whether you are pregnant or may be pregnant. How are the results reported? Your results will be reported as a value that measures sedimentation rate in millimeters per hour (mm/hr). Your health care provider will compare your results to normal ranges that were established after testing a large group of people (reference values). Reference values may vary among labs and hospitals. For this  test, common reference values, which vary by age and gender, are: Newborn: 0-2 mm/hr. Child, up to puberty: 0-10 mm/hr. Female: Under 50 years: 0-20 mm/hr. 50-85 years: 0-30 mm/hr. Over 85 years: 0-42 mm/hr. Female: Under 50 years: 0-15 mm/hr. 50-85 years: 0-20 mm/hr. Over 85 years: 0-30 mm/hr. Certain conditions or medicines may cause ESR levels to be falsely lower or higher, such as: Pregnancy. Obesity. Steroids, birth control pills, and blood thinners. Thyroid or kidney disease. What do the results mean? Results that are within reference values are considered normal, meaning that the level of inflammation in your body is healthy. High ESR levels mean that there is inflammation in your body. You will have more tests to help make adiagnosis. Inflammation may result from many different conditions or injuries. Talk with your health care provider about what your results mean. Questions to ask your health care provider Ask your health care provider, or the department that is doing the test: When will my results be ready? How will I get my results? What are my treatment options? What other tests do I need? What are my next steps? Summary The erythrocyte sedimentation rate (ESR) test is used to help find illnesses associated with sudden (acute) or long-term (chronic) infections, inflammation, autoimmune diseases, cancer, or tissue death. If you have symptoms that may be related to any of these illnesses, your health care provider may do an ESR test before doing more specific tests. If you have an inflammatory immune disease, such as rheumatoid arthritis, you may have this test to help monitor your therapy. This test measures how long it takes for your red blood cells (erythrocytes) to settle in a solution over a certain amount of time (sedimentation rate). This  provides information about how much inflammation is present in the body.  C-Reactive Protein Test Why am I having this test? The  C-reactive protein (CRP) is a substance that the liver releases in response to inflammation within the body. You may have a CRP test to help diagnose: Serious bacterial or fungal infections. Inflammatory diseases, such as inflammatory bowel disease. Lupus, rheumatoid arthritis, or other autoimmune diseases. What is being tested? This test checks for the level of CRP in your blood. The level of CRP in yourbody increases greatly after a heart attack, infection, or injury. What kind of sample is taken?  A blood sample is required for this test. It is usually collected by insertinga needle into a blood vessel. Tell a health care provider about: All medicines you are taking, including vitamins, herbs, eye drops, creams, and over-the-counter medicines. Any medical conditions you have. The use of birth control pills or hormone replacement therapy such as estrogen. Any blood disorders you have. Whether you are pregnant or may be pregnant. How are the results reported? Your test results will be reported as a value that indicates how much CRP is in your blood. This will be reported as milligrams of CRP per liter (mg/L) ofblood. Your health care provider will compare your results to normal ranges that were established after testing a large group of people (reference ranges). Reference ranges may vary among labs and hospitals. For this test, thestandard CRP reference value is less than 10 mg/L. What do the results mean? A standard CRP test result that is less than 10 mg/L is considered normal, meaning that you do not have an abnormally high level of inflammation in yourbody. A standard CRP test result that is greater than 10 mg/L means that there is an abnormally high level of inflammation in your body that is causing CRP to be released. Inflammation may result from many different conditions or injuries.You will have more tests to help make a diagnosis. Talk with your health care provider about what your  results mean. Questions to ask your health care provider Ask your health care provider, or the department that is doing the test: When will my results be ready? How will I get my results? What are my treatment options? What other tests do I need? What are my next steps? Summary C-reactive protein (CPR) is a substance released by the liver in response to inflammation within the body. The CRP test may be performed to help diagnose infection and inflammatory conditions. Talk with your health care provider about what your results mean.  Vitamin D Test Why am I having this test? Vitamin D is a vitamin that your body makes when you are exposed to sunlight. It is also found naturally in fish and eggs, and it is added to some foods, such as cereals and dairy products. You need vitamin D to maintain bone health and to support your disease-fighting (immune) system. You may have a vitamin D test: To help diagnose osteoporosis or other bone disorders. To monitor treatment of osteoporosis or other bone disorders. If you have symptoms of a lack (deficiency) of vitamin D, such as experiencing broken bones from minor injuries. If you lack certain nutrients in your diet (dietary deficiencies). If you have problems absorbing nutrients during digestion. What is being tested? There are two types of vitamin D tests that measure slightly different things: The 25-hydroxyvitamin D test measures how much of a substance called 25-hydroxyvitamin D is in your blood. The body converts vitamin D to  25-hydroxyvitamin D in the liver. Measuring how much 25-hydroxyvitamin D is in your blood provides a good idea of your vitamin D levels. This is the most common type of vitamin D test. The 1,25-dihydroxyvitamin D test measures how much of a substance called 1,25-dihydroxyvitamin D is in your blood. The body converts vitamin D into this substance and then uses it for various functions. This test provides an idea of your total  vitamin D levels. What kind of sample is taken?  A blood sample is required for this test. It is usually collected by insertinga needle into a blood vessel or by sticking a finger with a small needle. Tell a health care provider about: Any allergies you have. All medicines you are taking, including vitamins, herbs, eye drops, creams, and over-the-counter medicines. Any blood disorders you have. Any surgeries you have had. Any medical conditions you have. Whether you are pregnant or may be pregnant. How are the results reported? Your test results will be reported as a value that tells you how much vitamin D is in your blood. Your health care provider will compare your results to normal ranges that were established after testing a large group of people (reference ranges). Reference ranges may vary among labs and hospitals. For vitamin D tests, common reference ranges are: 25-hydroxyvitamin D: 25-80 ng/mL. 1,25-dihydroxyvitamin D: Males: 18-64 pg/mL. Females: 18-78 pg/mL. What do the results mean? If your result is within your reference range, this means that you have anormal amount of vitamin D in your blood. If your result is lower than your reference range, this means that you have too little vitamin D in your body. If you had the 25-hydroxyvitamin D test specifically: A result of 21-29 ng/mL means that you have slightly lower levels of vitamin D than normal (vitamin D insufficiency). A result of 20 ng/mL or less means that you have very low levels of vitamin D (vitamin D deficiency). Low levels of vitamin D can indicate: Not having enough exposure to sunlight. Not eating enough foods that contain vitamin D. Osteoporosis. Kidney disease. Liver disease. A bone disease that makes the bones weak, soft, or poorly developed (rickets). Softening of the bones (osteomalacia). The body not absorbing vitamin D normally (gastrointestinal malabsorption). If your result is higher than your  reference range, this means that you have too much vitamin D in your body. This can result from: Taking too many dietary supplements. Hyperparathyroidism. This is a rare condition in which you do not make enough of a certain type of hormone (parathyroid hormone or PTH). A disease that causes inflammation in your organs and other areas of your body (sarcoidosis). A rare developmental disorder that is present at birth Jimmye Norman syndrome). Talk with your health care provider about what your results mean. Questions to ask your health care provider Ask your health care provider, or the department that is doing the test: When will my results be ready? How will I get my results? What are my treatment options? What other tests do I need? What are my next steps? Summary You may have a vitamin D test to determine if your body has enough vitamin D. You need vitamin D to maintain bone health and to support your disease-fighting (immune) system. Your vitamin D level is determined with a blood test. Talk with your health care provider about what your results mean.  Parathyroid Hormone Test Why am I having this test? The parathyroid hormone (PTH) test may be used to evaluate the function of your parathyroid  glands and to check for or monitor conditions that affect the calcium levels in your body. The parathyroid glands are four tiny glands that surround the larger thyroid gland in your neck. When your blood calcium levels are low, your parathyroid glands release PTH into the blood. This causes several changes in your body that result in an increase in your blood calcium level. When blood calcium levels are normal or too high, blood PTH levelsdecrease. Your health care provider may want you to have this test if you have: Signs of higher-than-normal blood calcium levels (hypercalcemia). Signs of lower-than-normal blood calcium levels (hypocalcemia). A kidney infection. Kidney disease. Certain cancer cells  that secrete PTH. What is being tested? This test measures the amount of PTH in your blood. What kind of sample is taken?  A blood sample is required for this test. It is usually collected by insertinga needle into a blood vessel or by sticking a finger with a small needle. How do I prepare for this test? Do not eat or drink anything after midnight on the night before the test or as told byyour health care provider. How are the results reported? Your test results will be reported as values that indicate the amount of PTH in your blood. Your health care provider will compare your results to normal ranges that were established after testing a large group of people (reference ranges). The results may include amounts for whole PTH and its fragments (PTH N-terminal and PTH C-terminal). Reference ranges may vary among labs and hospitals. For this test, common reference ranges are: PTH intact (whole): 10-65 pg/mL or 10-65 ng/L (SI units). PTH N-terminal: 8-24 pg/mL or 8-24 ng/L (SI units). PTH C-terminal: 50-330 pg/mL or 50-330 ng/L (SI units). What do the results mean? Test results that are higher than the reference range may mean that you have: Overactive parathyroid glands (hyperparathyroidism). A non-parathyroid PTH-producing tumor. A kidney defect that is present at birth (congenital). Hypocalcemia. Long-term (chronic) kidney failure. Malabsorption syndrome. This means that your body does not properly absorb nutrients such as calcium from the intestinal tract. Vitamin D deficiency. Adequate vitamin D levels are required for calcium to be absorbed from the intestinal tract. Vitamin D deficiency can lead to low levels of calcium in the blood. Test results that are lower than the reference range may indicate any of the following: Underactive parathyroid glands (hypoparathyroidism). Hypercalcemia. Bone tumor. A disease that causes inflammation in your organs and other areas of your body  (sarcoidosis). Vitamin D intoxication. Milk-alkali syndrome. An immune system disorder (DiGeorge syndrome). Talk with your health care provider about what your results mean. Questions to ask your health care provider Ask your health care provider, or the department that is doing the test: When will my results be ready? How will I get my results? What are my treatment options? What other tests do I need? What are my next steps? Summary The parathyroid hormone (PTH) test may be used to evaluate the function of your parathyroid glands and to check for or monitor conditions that affect the calcium levels in your body. The parathyroid glands are four tiny glands that surround the larger thyroid gland in your neck. When your blood calcium levels are low, your parathyroid glands release PTH into the blood. Talk with your health care provider about what your results mean.

## 2021-02-06 NOTE — Progress Notes (Signed)
Office Visit Note  Patient: Julie Richard             Date of Birth: 1945/07/31           MRN: [id number]             PCP: Tresa Garter, MD Referring: Olive Bass,* Visit Date: 02/06/2021   Subjective:  New Patient (Initial Visit) (Patient complains of neck pain- she has been taking Tylenol 650 mg as needed for pain. History of osteoporosis, patient is on Prolia every 6 months. Patient is taking OTC Calcium and Vit D. Patient reports multiple fractures, most recent being rib, back, foot, and ankle. )   History of Present Illness: Julie Richard is a 75 y.o. female here for severe osteoporosis with history of prolia treatment and continued fractures. She has previous glucocorticoid treatment for temporal arteritis. She was originally diagnosed with osteoporosis decades ago with initial treatment on Actonel starting about 2000 was treated for at least 5 years not sure of exact duration then transition to annual zoledronic acid.  She was referred to rheumatology care in 2017 with Dr. Wilmer Floor at [facility] due to development of bilateral jaw pain and right temple and scalp pain ultrasound examination is consistent with temporal arteritis and she continued a slow tapering course of prednisone for about 14 months.  By the time of initial consultation evaluation she had been on steroids for about 1 month and a temporal artery biopsy was deferred.  She changed to treatment with Prolia injection starting in 2019 which has been continued with most recent dose on October 12, 2020.  She has most recently sustained fractures in October 2020 one of her foot and rib after a ground-level height fall. Besides management of osteoporosis ongoing she is here today with increase in symptoms of increased pain in her neck jaw and temple increased during the past several weeks.  She notices tenderness to pressure along the right area of her temple and some aching in this area.  She denies any vision changes.   She reports increased trouble with chewing though she denies specifically pain or symptoms becoming worse with prolonged chewing.  Bilateral neck pain worse to the left and the right and increases with rotation.   Previously actonel-zolendronic acid-on prolia since 2019 last dose 10/12/20 Foot and rib fracture 04/2020 TA diagnosis with Dr. Wilmer Floor in 2017 started with jaw pain and developed R>L facial and scalp pain, ultrasound findings consistent with GCA, no biopsy, responded to steroids took 14 months total medication  Imaging reviewed 03/23/19 DXA Lumbar spine  -1.7 (from -2.0) L Femoral neck -2.5 (from -2.3) L total hip  -2.2 (from -2.0) R Femoral neck -2.4 (from -2.5) R total hip  -2.7 (from -2.3)  01/31/21 Xray cervical spine IMPRESSION: 1. No acute osseous abnormality identified in the cervical spine. 2. Mild degenerative anterolisthesis at C7-T1 with at least moderate associated facet arthropathy. 3. 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  05/16/20 CT Chest IMPRESSION: 1. No acute intrathoracic process. 2. Subacute appearing nondisplaced fracture of the posterior left sixth rib. 3. Two 4 mm left lower lobe pulmonary nodules are unchanged since May. No follow-up needed if patient is low-risk (and has no known or suspected primary neoplasm). Non-contrast chest CT can be considered in 6 months if patient is high-risk. This recommendation follows the consensus statement: Guidelines for Management of Incidental Pulmonary Nodules Detected on CT Images: From the Fleischner Society 2017; Radiology 2017; 284:228-243. 4. Aortic  Atherosclerosis (ICD10-I70.0).  Activities of Daily Living:  Patient reports morning stiffness for 0 minutes.   Patient Denies nocturnal pain. Difficulty dressing/grooming: Denies Difficulty climbing stairs: Denies Difficulty getting out of chair: Denies Difficulty using hands for taps, buttons, cutlery, and/or writing: Denies   Review of  Systems Constitutional:  Negative for fatigue. HENT:  Negative for mouth sores, mouth dryness and nose dryness.   Eyes:  Positive for dryness. Negative for pain, itching and visual disturbance. Respiratory:  Negative for cough, hemoptysis, shortness of breath and difficulty breathing.   Cardiovascular:  Negative for chest pain, palpitations and swelling in legs/feet. Gastrointestinal:  Negative for abdominal pain, blood in stool, constipation and diarrhea. Endocrine: Negative for increased urination. Genitourinary:  Negative for painful urination. Musculoskeletal:  Positive for joint pain, joint pain, joint swelling, myalgias, muscle tenderness and myalgias. Negative for muscle weakness and morning stiffness. Skin:  Negative for color change, rash and redness. Allergic/Immunologic: Negative for susceptible to infections. Neurological:  Negative for dizziness, numbness, headaches, memory loss and weakness. Hematological:  Negative for swollen glands. Psychiatric/Behavioral:  Negative for confusion and sleep disturbance.    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 10/21/2019   Temporal arteritis (HCC) 06/21/2019   Osteoporosis 06/21/2019   Osteoarthritis 06/21/2019   Scoliosis 06/21/2019   Dyslipidemia 06/21/2019   Caregiver stress 02/02/2018   Idiopathic scoliosis of thoracolumbar spine 12/08/2016   History of temporal arteritis 02/23/2016   Colon polyps 01/18/2016   FH: colonic  polyps 01/18/2016   Dysphagia, pharyngeal phase 10/22/2015   Cataract 02/11/2013    Past Medical History:  Diagnosis Date   Chicken pox    Colon polyps    Hyperlipidemia    Osteoporosis    Temporal arteritis (HCC)    Thyroid disorder     Family History  Problem Relation Age of Onset   Alzheimer's disease Mother    Early death Father    Cancer Sister        Rectal cancer   Graves' disease Sister    Hyperlipidemia Brother    Diabetes Maternal Grandmother    Diabetes Maternal Grandfather    Healthy Son    Leukemia Son    Past Surgical History:  Procedure Laterality Date   ANKLE SURGERY     CATARACT EXTRACTION     TONSILLECTOMY AND ADENOIDECTOMY     TUBAL LIGATION     Social History   Social History Narrative   Not on file   Immunization History  Administered Date(s) Administered   Fluad Quad(high Dose 65+) 03/31/2016, 05/11/2017, 05/07/2018   Influenza Split 04/30/2010, 04/09/2012   Influenza, High Dose Seasonal PF 03/31/2016, 05/11/2017, 05/07/2018, 04/09/2019   Influenza-Unspecified 04/23/2020   PFIZER Comirnaty(Gray Top)Covid-19 Tri-Sucrose Vaccine 10/19/2020   PFIZER(Purple Top)SARS-COV-2 Vaccination 07/12/2019, 07/31/2019, 04/03/2020   Pneumococcal Conjugate-13 11/30/2013   Pneumococcal Polysaccharide-23 07/07/2013   Pneumococcal-Unspecified 08/22/2010   Td 11/11/2005   Tdap 07/08/2015   Zoster Recombinat (Shingrix) 03/16/2018, 06/06/2018   Zoster, Live 03/15/2008     Objective: Vital Signs: BP  112/71 (BP Location: Right Arm, Patient Position: Sitting, Cuff Size: Normal)   Pulse 76   Ht 5' 1.25" (1.556 m)   Wt 107 lb 3.2 oz (48.6 kg)   BMI 20.09 kg/m    Physical Exam HENT:     Head:     Comments: Mild tenderness to pressure over right frontotemporal area, superficial branch temporal artery palpable pulse no visible or palpable changes    Right Ear: External ear normal.     Left Ear: External ear normal.     Mouth/Throat:     Mouth: Mucous  membranes are moist.     Pharynx: Oropharynx is clear.  Eyes:     Conjunctiva/sclera: Conjunctivae normal.  Cardiovascular:     Rate and Rhythm: Normal rate and regular rhythm.  Pulmonary:     Effort: Pulmonary effort is normal.     Breath sounds: Normal breath sounds.  Skin:    General: Skin is warm and dry.     Findings: No rash.  Neurological:     General: No focal deficit present.     Mental Status: She is alert.  Psychiatric:        Mood and Affect: Mood normal.     Musculoskeletal Exam:  Neck slightly decreased lateral rotation, lateral tenderness to palpation on the sides of the neck Shoulders mildly decreased ROM, pain more towards midline not over AC or lateral shoulder Elbows full ROM no tenderness or swelling Wrists full ROM no tenderness or swelling Fingers full ROM no tenderness or swelling Knees full ROM no tenderness or swelling   Investigation: No additional findings.  Imaging: DG Cervical Spine Complete  Result Date: 01/31/2021 CLINICAL DATA:  75 year old female with lower neck pain for 5 days. Pain radiating to the base of the head. Painful range of motion. EXAM: CERVICAL SPINE - COMPLETE 4+ VIEW COMPARISON:  None. FINDINGS: Normal prevertebral soft tissue contour. Mild straightening of cervical lordosis. Subtle anterolisthesis of C7 on T1 appears to be degenerative, with multilevel cervical facet hypertrophy but at least moderate and most pronounced at the cervicothoracic junction. Bilateral posterior element alignment is within normal limits. Normal C1-C2 alignment. Possible left C1-C2 joint space loss. No acute osseous abnormality identified. Moderate to severe disc space loss with endplate spurring Q5-Z5 through C5-C6. Negative visible upper chest. IMPRESSION: 1. No acute osseous abnormality identified in the cervical spine. 2. Mild degenerative anterolisthesis at C7-T1 with at least moderate associated facet arthropathy. 3. Advanced disc and endplate  degeneration C3-C4 through C5-C6. Electronically Signed   By: Odessa Fleming M.D.   On: 01/31/2021 09:45    Recent Labs: Lab Results  Component Value Date   WBC 6.7 05/03/2020   HGB 12.6 05/03/2020   PLT 191 05/03/2020   NA 140 02/06/2021   K 4.8 02/06/2021   CL 102 02/06/2021   CO2 28 02/06/2021   GLUCOSE 79 02/06/2021   BUN 15 02/06/2021   CREATININE 0.66 02/06/2021   BILITOT 0.5 02/06/2021   ALKPHOS 53 04/11/2020   AST 29 02/06/2021   ALT 21 02/06/2021   PROT 7.2 02/06/2021   ALBUMIN 4.1 04/11/2020   CALCIUM 9.5 02/06/2021    Speciality Comments: No specialty comments available.  Procedures:  No procedures performed Allergies: Penicillins   Assessment / Plan:     Visit Diagnoses: Other osteoporosis, unspecified pathological fracture presence - Plan: Parathyroid hormone, intact (no Ca), VITAMIN D 25 Hydroxy (Vit-D Deficiency, Fractures), COMPLETE METABOLIC PANEL WITH GFR  Severe osteoporosis with bone densitometry findings and  recurrent fractures.  She may be a candidate for addition of anabolic agent to Prolia if there is not adequate response based on follow-up DEXA.  Recommend repeat bone densitometry test to compare progress since most recent reviewed study on 03/2019.  Recommend she have this ordered through PCP office due to better approval rate.  Also look for persistent underlying secondary causes checking PTH, vitamin D, SPEP, and complete metabolic panel.  Temporal arteritis (HCC) - Plan: Sedimentation rate, C-reactive protein  Current symptoms are not very typical for GCA will check serum inflammatory markers today. Distribution of neck and shoulder pain is not very consistent with PMR at this time. Would not confirm active GCA at this time it from symptoms barring other positive studies. Would have low threshold to consider Actemra treatment with her comorbidities.  Vitamin D deficiency - Plan: VITAMIN D 25 Hydroxy (Vit-D Deficiency, Fractures)  History of vitamin D  deficiency and osteoporosis though she has been on significant supplements will make sure currently adequate  High risk medication use - Plan: Serum protein electrophoresis with reflex, COMPLETE METABOLIC PANEL WITH GFR  Considering addition of anabolic medication also possible future DMARD treatment of GCA checking metabolic panel.  Orders: Orders Placed This Encounter  Procedures   Parathyroid hormone, intact (no Ca)   VITAMIN D 25 Hydroxy (Vit-D Deficiency, Fractures)   Serum protein electrophoresis with reflex   COMPLETE METABOLIC PANEL WITH GFR   Sedimentation rate   C-reactive protein    No orders of the defined types were placed in this encounter.   Follow-Up Instructions: Return in about 3 weeks (around 02/27/2021) for New pt OP/?GCA relapse f/u 3wks.   Fuller Plan, MD  Note - This record has been created using [employer].  Chart creation errors have been sought, but may not always  have been located. Such creation errors do not reflect on  the standard of medical care.

## 2021-02-07 ENCOUNTER — Ambulatory Visit
Admission: RE | Admit: 2021-02-07 | Discharge: 2021-02-07 | Disposition: A | Discharge status: Home / Self Care | Payer: Medicare Other | Source: Ambulatory Visit | Attending: Internal Medicine | Admitting: Internal Medicine

## 2021-02-07 DIAGNOSIS — Z1231 Encounter for screening mammogram for malignant neoplasm of breast: Secondary | ICD-10-CM

## 2021-02-13 LAB — COMPLETE METABOLIC PANEL WITH GFR
AG Ratio: 1.4 (calc) (ref 1.0–2.5)
ALT: 21 U/L (ref 6–29)
AST: 29 U/L (ref 10–35)
Albumin: 4.2 g/dL (ref 3.6–5.1)
Alkaline phosphatase (APISO): 56 U/L (ref 37–153)
BUN: 15 mg/dL (ref 7–25)
CO2: 28 mmol/L (ref 20–32)
Calcium: 9.5 mg/dL (ref 8.6–10.4)
Chloride: 102 mmol/L (ref 98–110)
Creat: 0.66 mg/dL (ref 0.60–1.00)
Globulin: 3 g/dL (calc) (ref 1.9–3.7)
Glucose, Bld: 79 mg/dL (ref 65–99)
Potassium: 4.8 mmol/L (ref 3.5–5.3)
Sodium: 140 mmol/L (ref 135–146)
Total Bilirubin: 0.5 mg/dL (ref 0.2–1.2)
Total Protein: 7.2 g/dL (ref 6.1–8.1)
eGFR: 91 mL/min/{1.73_m2} (ref >=60)

## 2021-02-13 LAB — PROTEIN ELECTROPHORESIS, SERUM, WITH REFLEX
Albumin ELP: 4 g/dL (ref 3.8–4.8)
Alpha 1: 0.4 g/dL — ABNORMAL HIGH (ref 0.2–0.3)
Alpha 2: 0.8 g/dL (ref 0.5–0.9)
Beta 2: 0.3 g/dL (ref 0.2–0.5)
Beta Globulin: 0.5 g/dL (ref 0.4–0.6)
Gamma Globulin: 1 g/dL (ref 0.8–1.7)
Total Protein: 7 g/dL (ref 6.1–8.1)

## 2021-02-13 LAB — IFE INTERPRETATION: Immunofix Electr Int: NOT DETECTED

## 2021-02-13 LAB — SEDIMENTATION RATE: Sed Rate: 17 mm/h (ref 0–30)

## 2021-02-13 LAB — C-REACTIVE PROTEIN: CRP: 1.8 mg/L (ref <8.0)

## 2021-02-13 LAB — PARATHYROID HORMONE, INTACT (NO CA): PTH: 39 pg/mL (ref 16–77)

## 2021-02-13 LAB — VITAMIN D 25 HYDROXY (VIT D DEFICIENCY, FRACTURES): Vit D, 25-Hydroxy: 73 ng/mL (ref 30–100)

## 2021-02-13 NOTE — Progress Notes (Signed)
I sent a message to Dr. Posey Rea specifically. I would not recommend change in osteoporosis treatment until after getting an updated scan. If she feels the other symptoms with headache or jaw pain get worse we can take another look, otherwise can reschedule to later.

## 2021-02-13 NOTE — Progress Notes (Signed)
Results are normal, I do not see any inflammation markers to suggesting active temporal arteritis. Test for underlying causes of osteoporosis are unremarkable. We can discuss her symptoms and plan in follow up.

## 2021-02-17 ENCOUNTER — Other Ambulatory Visit: Payer: Self-pay | Admitting: Internal Medicine

## 2021-02-17 DIAGNOSIS — N959 Unspecified menopausal and perimenopausal disorder: Secondary | ICD-10-CM

## 2021-02-17 DIAGNOSIS — M818 Other osteoporosis without current pathological fracture: Secondary | ICD-10-CM

## 2021-02-19 ENCOUNTER — Telehealth: Payer: Self-pay | Admitting: Internal Medicine

## 2021-02-19 NOTE — Telephone Encounter (Signed)
I am not accpeting new patients at this time.

## 2021-02-19 NOTE — Telephone Encounter (Signed)
OK w/me to transfer. Thx

## 2021-02-19 NOTE — Telephone Encounter (Signed)
Patient is requesting a transfer of care to Dr. Cheryll Cockayne. Stated she would prefer a female physician.   Please advise.

## 2021-02-20 ENCOUNTER — Other Ambulatory Visit: Payer: Self-pay | Admitting: Internal Medicine

## 2021-02-27 ENCOUNTER — Ambulatory Visit: Payer: Medicare Other | Admitting: Internal Medicine

## 2021-03-04 ENCOUNTER — Ambulatory Visit: Payer: Medicare Other | Admitting: Internal Medicine

## 2021-03-25 ENCOUNTER — Ambulatory Visit: Payer: Medicare Other | Admitting: Internal Medicine

## 2021-03-26 ENCOUNTER — Ambulatory Visit
Admission: RE | Admit: 2021-03-26 | Discharge: 2021-03-26 | Disposition: A | Discharge status: Home / Self Care | Payer: Medicare Other | Source: Ambulatory Visit | Attending: Internal Medicine | Admitting: Internal Medicine

## 2021-03-26 ENCOUNTER — Other Ambulatory Visit: Payer: Self-pay

## 2021-03-26 DIAGNOSIS — N959 Unspecified menopausal and perimenopausal disorder: Secondary | ICD-10-CM

## 2021-03-26 DIAGNOSIS — M818 Other osteoporosis without current pathological fracture: Secondary | ICD-10-CM

## 2021-04-01 ENCOUNTER — Other Ambulatory Visit (HOSPITAL_BASED_OUTPATIENT_CLINIC_OR_DEPARTMENT_OTHER): Payer: Self-pay

## 2021-04-01 ENCOUNTER — Encounter: Payer: Medicare Other | Admitting: Internal Medicine

## 2021-04-01 NOTE — Progress Notes (Signed)
Office Visit Note  Patient: Julie Richard             Date of Birth: February 10, 1946           MRN: [id number]             PCP: Tresa Garter, MD Referring: Tresa Garter, MD Visit Date: 04/02/2021   Subjective:   History of Present Illness: Julie Richard is a 75 y.o. female here for follow up for severe osteoporosis with ongoing recent fractures and history of GCA treated with prolonged prednisone tapering. At initial visit inflammatory markers were completely normal also screening for secondary causes of osteoporosis, besides the known steroid use, was unremarkable. Since our last visit she underwent repeat bone densitometry on 9/20 showing left femur osteopenia -2.2 (0.675) and left distal radius osteoporosis -2.8 (0.523). She has not suffered significant return of temporal arteritis symptoms such as headache or jaw pain. She has experienced more joint pain worst in the right 5th finger.  Previous HPI 02/06/21 [name] is a 75 y.o. female here for severe osteoporosis with history of prolia treatment and continued fractures. She has previous glucocorticoid treatment for temporal arteritis. She was originally diagnosed with osteoporosis decades ago with initial treatment on Actonel starting about 2000 was treated for at least 5 years not sure of exact duration then transition to annual zoledronic acid.  She was referred to rheumatology care in 2017 with Dr. Wilmer Floor at [facility] due to development of bilateral jaw pain and right temple and scalp pain ultrasound examination is consistent with temporal arteritis and she continued a slow tapering course of prednisone for about 14 months.  By the time of initial consultation evaluation she had been on steroids for about 1 month and a temporal artery biopsy was deferred.  She changed to treatment with Prolia injection starting in 2019 which has been continued with most recent dose on October 12, 2020.  She has most recently sustained  fractures in October 2020 one of her foot and rib after a ground-level height fall. Besides management of osteoporosis ongoing she is here today with increase in symptoms of increased pain in her neck jaw and temple increased during the past several weeks.  She notices tenderness to pressure along the right area of her temple and some aching in this area.  She denies any vision changes.  She reports increased trouble with chewing though she denies specifically pain or symptoms becoming worse with prolonged chewing.  Bilateral neck pain worse to the left and the right and increases with rotation.     Previously actonel-zolendronic acid-on prolia since 2019 last dose 10/12/20 Foot and rib fracture 04/2020 TA diagnosis with Dr. Wilmer Floor in 2017 started with jaw pain and developed R>L facial and scalp pain, ultrasound findings consistent with GCA, no biopsy, responded to steroids took 14 months total medication   Imaging reviewed 03/23/19 DXA Lumbar spine              -1.7 (from -2.0) L Femoral neck           -2.5 (from -2.3) L total hip                    -2.2 (from -2.0) R Femoral neck          -2.4 (from -2.5) R total hip                    -2.7 (from -2.3)  01/31/21 Xray cervical spine IMPRESSION: 1. No acute osseous abnormality identified in the cervical spine. 2. Mild degenerative anterolisthesis at C7-T1 with at least moderate associated facet arthropathy. 3. 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   05/16/20 CT Chest IMPRESSION: 1. No acute intrathoracic process. 2. Subacute appearing nondisplaced fracture of the posterior left sixth rib. 3. Two 4 mm left lower lobe pulmonary nodules are unchanged since May. No follow-up needed if patient is low-risk (and has no known or suspected primary neoplasm). Non-contrast chest CT can be considered in 6 months if patient is high-risk. This recommendation follows the consensus statement: Guidelines for Management of Incidental  Pulmonary Nodules Detected on CT Images: From the Fleischner Society 2017; Radiology 2017; 284:228-243. 4. Aortic Atherosclerosis (ICD10-I70.0).  Review of Systems  Constitutional:  Negative for fatigue.  HENT:  Negative for mouth sores, mouth dryness and nose dryness.   Eyes:  Positive for dryness. Negative for pain and itching.  Respiratory:  Negative for shortness of breath and difficulty breathing.   Cardiovascular:  Negative for chest pain and palpitations.  Gastrointestinal:  Negative for blood in stool, constipation and diarrhea.  Endocrine: Negative for increased urination.  Genitourinary:  Negative for difficulty urinating.  Musculoskeletal:  Positive for joint pain, joint pain and joint swelling. Negative for myalgias, morning stiffness, muscle tenderness and myalgias.  Skin:  Negative for color change, rash and redness.  Allergic/Immunologic: Negative for susceptible to infections.  Neurological:  Negative for dizziness, numbness, headaches, memory loss and weakness.  Hematological:  Positive for bruising/bleeding tendency.  Psychiatric/Behavioral:  Negative for confusion.    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 10/21/2019   Temporal arteritis (HCC) 06/21/2019   Osteoporosis 06/21/2019   Osteoarthritis 06/21/2019   Scoliosis 06/21/2019   Dyslipidemia 06/21/2019   Caregiver stress 02/02/2018   Idiopathic scoliosis of thoracolumbar  spine 12/08/2016   History of temporal arteritis 02/23/2016   Colon polyps 01/18/2016   FH: colonic polyps 01/18/2016   Dysphagia, pharyngeal phase 10/22/2015   Cataract 02/11/2013    Past Medical History:  Diagnosis Date   Chicken pox    Colon polyps    Hyperlipidemia    Osteoporosis    Temporal arteritis (HCC)    Thyroid disorder     Family History  Problem Relation Age of Onset   Alzheimer's disease Mother    Early death Father    Cancer Sister        Rectal cancer   Graves' disease Sister    Hyperlipidemia Brother    Diabetes Maternal Grandmother    Diabetes Maternal Grandfather    Healthy Son    Leukemia Son    Past Surgical History:  Procedure Laterality Date   ANKLE SURGERY     CATARACT EXTRACTION     TONSILLECTOMY AND ADENOIDECTOMY     TUBAL LIGATION     Social History   Social History Narrative   Not on file   Immunization History  Administered Date(s) Administered  Fluad Quad(high Dose 65+) 03/31/2016, 05/11/2017, 05/07/2018   Influenza Split 04/30/2010, 04/09/2012   Influenza, High Dose Seasonal PF 03/31/2016, 05/11/2017, 05/07/2018, 04/09/2019   Influenza-Unspecified 04/23/2020   PFIZER Comirnaty(Gray Top)Covid-19 Tri-Sucrose Vaccine 10/19/2020   PFIZER(Purple Top)SARS-COV-2 Vaccination 07/12/2019, 07/31/2019, 04/03/2020   Pfizer Covid-19 Vaccine Bivalent Booster 70yrs & up 04/04/2021   Pneumococcal Conjugate-13 11/30/2013   Pneumococcal Polysaccharide-23 07/07/2013   Pneumococcal-Unspecified 08/22/2010   Td 11/11/2005   Tdap 07/08/2015   Zoster Recombinat (Shingrix) 03/16/2018, 06/06/2018   Zoster, Live 03/15/2008     Objective: Vital Signs: BP 137/70 (BP Location: Left Arm, Patient Position: Sitting, Cuff Size: Small)   Pulse 69   Resp 13   Ht 5\' 1"  (1.549 m)   Wt 110 lb 6.4 oz (50.1 kg)   BMI 20.86 kg/m    Physical Exam Cardiovascular:     Rate and Rhythm: Normal rate and regular rhythm.  Pulmonary:     Effort: Pulmonary effort  is normal.     Breath sounds: Normal breath sounds.  Skin:    General: Skin is warm and dry.     Findings: No rash.  Neurological:     Mental Status: She is alert.     Musculoskeletal Exam:  Shoulders full ROM no tenderness or swelling Elbows full ROM no tenderness or swelling Wrists full ROM no tenderness or swelling Fingers full ROM, bony enlargement of right 5th PIP without synovitis Knees full ROM no tenderness or swelling  Investigation: No additional findings.  Imaging: No results found.  Recent Labs: Lab Results  Component Value Date   WBC 6.7 05/03/2020   HGB 12.6 05/03/2020   PLT 191 05/03/2020   NA 140 02/06/2021   K 4.8 02/06/2021   CL 102 02/06/2021   CO2 28 02/06/2021   GLUCOSE 79 02/06/2021   BUN 15 02/06/2021   CREATININE 0.66 02/06/2021   BILITOT 0.5 02/06/2021   ALKPHOS 53 04/11/2020   AST 29 02/06/2021   ALT 21 02/06/2021   PROT 7.0 02/06/2021   PROT 7.2 02/06/2021   ALBUMIN 4.1 04/11/2020   CALCIUM 9.5 02/06/2021    Speciality Comments: No specialty comments available.  Procedures:  No procedures performed Allergies: Penicillins   Assessment / Plan:     Visit Diagnoses: Temporal arteritis (HCC)  No inflammatory changes or return of past TA symptoms. We can follow up as needed if she does experience symptoms return but no maintenance/routine f/u needed.  Other osteoporosis, unspecified pathological fracture presence  Most recent bone densitometry looks okay response to prolia, peripheral skeleton response is often less with this treatment and less concerning for major osteoporotic fracture, recommend continuing current treatment.  Bilateral hand pain - Plan: Ambulatory referral to Occupational Therapy  Hand pain appears to be from osteoarthritis, would like to avoid additional medication use and has some decreased strength will refer to OT for management.  Orders: Orders Placed This Encounter  Procedures   Ambulatory referral to  Occupational Therapy    No orders of the defined types were placed in this encounter.    Follow-Up Instructions: Return if symptoms worsen or fail to improve.   04/08/2021, MD  Note - This record has been created using [employer].  Chart creation errors have been sought, but may not always  have been located. Such creation errors do not reflect on  the standard of medical care.

## 2021-04-02 ENCOUNTER — Ambulatory Visit (INDEPENDENT_AMBULATORY_CARE_PROVIDER_SITE_OTHER): Payer: Medicare Other | Admitting: Internal Medicine

## 2021-04-02 ENCOUNTER — Telehealth: Payer: Self-pay

## 2021-04-02 ENCOUNTER — Other Ambulatory Visit: Payer: Self-pay

## 2021-04-02 ENCOUNTER — Encounter: Payer: Self-pay | Admitting: Internal Medicine

## 2021-04-02 VITALS — BP 137/70 | HR 69 | Resp 13 | Ht 61.0 in | Wt 110.4 lb

## 2021-04-02 DIAGNOSIS — M79641 Pain in right hand: Secondary | ICD-10-CM

## 2021-04-02 DIAGNOSIS — M47816 Spondylosis without myelopathy or radiculopathy, lumbar region: Secondary | ICD-10-CM | POA: Diagnosis not present

## 2021-04-02 DIAGNOSIS — M316 Other giant cell arteritis: Secondary | ICD-10-CM

## 2021-04-02 DIAGNOSIS — M818 Other osteoporosis without current pathological fracture: Secondary | ICD-10-CM

## 2021-04-02 DIAGNOSIS — M79642 Pain in left hand: Secondary | ICD-10-CM

## 2021-04-02 NOTE — Telephone Encounter (Signed)
Prolia VOB initiated via [email or web address]  Last OV:  Next OV:  Last Prolia inj: 10/12/20 Next Prolia inj DUE: 04/14/21  

## 2021-04-04 ENCOUNTER — Other Ambulatory Visit: Payer: Self-pay

## 2021-04-04 ENCOUNTER — Ambulatory Visit: Payer: Medicare Other | Attending: Internal Medicine

## 2021-04-04 ENCOUNTER — Ambulatory Visit: Payer: Medicare Other | Attending: Internal Medicine | Admitting: Occupational Therapy

## 2021-04-04 ENCOUNTER — Encounter: Payer: Self-pay | Admitting: Occupational Therapy

## 2021-04-04 DIAGNOSIS — M79642 Pain in left hand: Secondary | ICD-10-CM

## 2021-04-04 DIAGNOSIS — M79641 Pain in right hand: Secondary | ICD-10-CM | POA: Insufficient documentation

## 2021-04-04 DIAGNOSIS — M6281 Muscle weakness (generalized): Secondary | ICD-10-CM

## 2021-04-04 DIAGNOSIS — Z23 Encounter for immunization: Secondary | ICD-10-CM

## 2021-04-04 NOTE — Telephone Encounter (Signed)
Pt ready for scheduling on or after 04/15/21  Out-of-pocket cost due at time of visit: $0.00  Primary:  Prolia co-insurance: 20% (approximately $255) Admin fee co-insurance: 20% (approximately $25)  Secondary: BCBS Prolia co-insurance:  covers the Medicare Part B deductible, co-insurance and 100% of the excess charges Admin fee co-insurance:  covers the Medicare Part B deductible, co-insurance and 100% of the excess charges  Deductible: $233 of $233 met  Prior Auth: not required PA# Valid:   ** This summary of benefits is an estimation of the patient's out-of-pocket cost. Exact cost may vary based on individual plan coverage.

## 2021-04-05 NOTE — Therapy (Signed)
[facility] [address] [place], [place], [address] Phone: [phone]   Fax:  [phone]  Occupational Therapy Evaluation  Patient Details  Name: Julie Richard MRN: [id number] Date of Birth: Apr 04, 1946 Referring Provider (OT): Sheliah Hatch, MD (Rheumatology)   Encounter Date: 04/04/2021   OT End of Session - 04/04/21 1401     Visit Number 1    Number of Visits 4    Date for OT Re-Evaluation 06/03/21    Authorization Type Medicare (primary); BCBS (secondary)    Authorization Time Period VL: [place]    OT Start Time 1355    OT Stop Time 1441    OT Time Calculation (min) 46 min    Activity Tolerance Patient tolerated treatment well    Behavior During Therapy [facility] for tasks assessed/performed            Past Medical History:  Diagnosis Date   Chicken pox    Colon polyps    Hyperlipidemia    Osteoporosis    Temporal arteritis (HCC)    Thyroid disorder     Past Surgical History:  Procedure Laterality Date   ANKLE SURGERY     CATARACT EXTRACTION     TONSILLECTOMY AND ADENOIDECTOMY     TUBAL LIGATION      There were no vitals filed for this visit.   Subjective Assessment - 04/04/21 1355     Subjective  Pt arrives to session today w/ primary concerns regarding pain and decreased strength in both her hands w/ the R hand impacted more so than the L hand. Functionally, pt states she is able to do almost everything and has thus far been able to naturally identify compensatory methods to avoid/decrease pain prn. Reports pain does occasionally wake her up at night, but she has found effective methods of relief w/ heating pad and OTC medication. At this time, primary complaint is w/ opening jars and decreased grip strength.    Pertinent History Bilateral hand osteoarthritis; PMH includes HLD, temporal arteritis, hx of cataracts/corrective surgery, osteoporosis, DJD, and dyslipidemia    Patient Stated Goals "Being able to  open a bottle of water"    Currently in Pain? Yes    Pain Score 1     Pain Location Finger (Comment which one)    Pain Orientation Right    Pain Type Chronic pain    Pain Onset More than a month ago    Aggravating Factors  Opening containers, tight grasp/grip    Pain Relieving Factors Warmth/heating pad, OTC medication             [facility] OT Assessment - 04/04/21 1407       Assessment   Medical Diagnosis Bilateral hand osteoarthritis    Referring Provider (OT) Sheliah Hatch, MD   Rheumatology   Hand Dominance Right    Prior Therapy OP PT      Precautions   Precautions None    Other Brace/Splint Has tried compression sleeves, but no longer wears them      Balance Screen   Has the patient fallen in the past 6 months No      Home  Environment   Type of Home House    Home Access Level entry    Home Layout One level    Bathroom Copywriter, advertising    Additional Comments Husband currently in memory care facility    Lives With Alone      Prior Function   Level of  Independence Independent    Vocation Retired    Leisure Set designer; going on walks      ADL   Eating/Feeding Modified independent    Grooming Modified independent    Upper Body Bathing Independent    Lower Body Bathing Independent    Upper Body Dressing Increased time    Lower Body Dressing Increased time    Toileting -  Hygiene Independent    ADL comments Reports occasionally requiring extended time or incorporation of compensatory strategies w/ ADLs/IADLs due to hand pain; states she is able to do "everything I need to" w/ the exception of opening bottles/jars      IADL   Shopping Takes care of all shopping needs independently    Light Housekeeping Maintains house alone or with occasional assistance    Meal Prep Able to complete simple warm meal prep;Able to complete simple cold meal and snack prep   Reports decreased participation in cooking since her husband was moved to memory  care facility   [employer] Drives own vehicle    Medication Management Is responsible for taking medication in correct dosages at correct time    Financial Management Manages financial matters independently (budgets, writes checks, pays rent, bills goes to bank), collects and keeps track of income      Written Expression   Dominant Hand Right    Written Experience Within Functional Limits      Vision - History   Baseline Vision Wears glasses for distance only    Visual History Cataracts   Has had corrective surgery     Cognition   Overall Cognitive Status Within Functional Limits for tasks assessed      Observation/Other Assessments   Observations Prominent Bouchard's node on 5th digit of R hand    Upper Extremity Functional Index  76/80      Sensation   Light Touch Appears Intact    Hot/Cold Appears Intact   per pt report     Coordination   Gross Motor Movements are Fluid and Coordinated Yes    Fine Motor Movements are Fluid and Coordinated Yes    9 Hole Peg Test Right;Left    Right 9 Hole Peg Test 26 sec    Left 9 Hole Peg Test 26 sec      ROM / Strength   AROM / PROM / Strength AROM      Palpation   Palpation comment Tenderness to palpation; particularly R long PIP > DIP      AROM   Overall AROM  Within functional limits for tasks performed    Overall AROM Comments Slightly decreased finger extension of R long and ring fingers; remains WFL    AROM Assessment Site Shoulder;Elbow;Forearm;Wrist;Finger;Thumb      Hand Function   Right Hand Gross Grasp Functional    Right Hand Grip (lbs) 23    Right Hand Lateral Pinch 11 lbs    Right Hand 3 Point Pinch 10 lbs    Left Hand Gross Grasp Functional    Left Hand Grip (lbs) 34    Left Hand Lateral Pinch 12 lbs    Left 3 point pinch 12 lbs             OT Education - 04/04/21 1401     Education Details Education provided on role and purpose of OT, as well as potential interventions and goals for therapy based  on initial evaluation findings. OT also introduced joint protection strategies w/ corresponding handout administered and  recommendation of AE for opening bottles and containers (examples shown, no handout provided)    Person(s) Educated Patient    Methods Explanation;Handout    Comprehension Verbalized understanding             OT Short Term Goals - 04/04/21 1503       OT SHORT TERM GOAL #1   Title Pt will be able to open at least 5 various bottles and containers w/ Mod I, using AE prn, reporting pain less than 2/10    Baseline Reports "quite a bit of difficulty" w/ opening jars on UEFI    Time 2    Period Weeks    Status New    Target Date 04/19/21      OT SHORT TERM GOAL #2   Title Pt will improve grip strength of R, dominant hand by at least 4 lbs w/ pain less than 2/10    Baseline R hand 23# (L hand 34#)    Time 3    Period Weeks    Status New    Target Date 04/26/21      OT SHORT TERM GOAL #3   Title Pt will demonstrate independence w/ wear and care of orthosis, if needed    Baseline No brace/orthosis at this time    Time 3    Period Weeks    Status New    Target Date 04/26/21             Plan - 04/04/21 1455     Clinical Impression Statement Pt is a 75 y/o female who presents to OP OT due to bilateral hand osteoarthritis. Pt currently lives alone and reports her husband is currently residing in a memory care facility. Pt's primary concerns are related to decreased hand strength and pain. PMHx includes HLD, temporal arteritis, hx of cataracts/corrective surgery, osteoporosis, DJD, and dyslipidemia. Pt will benefit from skilled occupational therapy services to address aforementioned concerns, as well as for introduction of compensatory strategies, including AE prn, and implementation of an HEP to improve participation and decrease symptoms during ADL tasks.    OT Occupational Profile and History Problem Focused Assessment - Including review of records relating to  presenting problem    Occupational performance deficits (Please refer to evaluation for details): ADL's;IADL's;Rest and Sleep;Leisure    Body Structure / Function / Physical Skills ADL;Decreased knowledge of precautions;UE functional use;Body mechanics;Dexterity;Sensation;Edema;Strength;Pain;IADL    Rehab Potential Fair    Clinical Decision Making Limited treatment options, no task modification necessary    Comorbidities Affecting Occupational Performance: May have comorbidities impacting occupational performance    Modification or Assistance to Complete Evaluation  No modification of tasks or assist necessary to complete eval    OT Frequency 1x / week    OT Duration 2 weeks    OT Treatment/Interventions Self-care/ADL training;Therapeutic exercise;Patient/family education;Splinting;Compression bandaging;Paraffin;Moist Heat;Fluidtherapy;Therapeutic activities;Manual Therapy;DME and/or AE instruction;Ultrasound;Energy conservation    Plan Initiate light strengthening; review potential benefit of orthosis for support; identify/recommend appropriate AE    Consulted and Agree with Plan of Care Patient            Patient will benefit from skilled therapeutic intervention in order to improve the following deficits and impairments:   Body Structure / Function / Physical Skills: ADL, Decreased knowledge of precautions, UE functional use, Body mechanics, Dexterity, Sensation, Edema, Strength, Pain, IADL   Visit Diagnosis: Pain in right hand  Pain in left hand  Muscle weakness (generalized)   Problem List Patient Active Problem List  Diagnosis Date Noted   Bilateral hand pain 04/02/2021   Hyperlipidemia 02/06/2021   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 02/06/2021   Vitamin D deficiency 02/06/2021   High risk medication use 02/06/2021   Rash 10/24/2020   Breast nodule 10/24/2020   OAB (overactive bladder) 08/01/2020   Vitamin D toxicity 04/11/2020    Situational anxiety 02/23/2020   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   Coronary atherosclerosis 02/23/2020   Drusen (degenerative) of retina, bilateral 10/21/2019   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 [place], [place], [address] Phone: [phone]   Fax:  [phone]  Name: Julie Richard MRN: [id number] Date of Birth: 1945-11-22

## 2021-04-05 NOTE — Progress Notes (Signed)
   Covid-19 Vaccination Clinic  Name:  Julie Richard    MRN: [id number] DOB: Jan 18, 1946  04/05/2021  [name] was observed post Covid-19 immunization for 15 minutes without incident. She was provided with Vaccine Information Sheet and instruction to access the V-Safe system.   [name] was instructed to call 911 with any severe reactions post vaccine: Difficulty breathing  Swelling of face and throat  A fast heartbeat  A bad rash all over body  Dizziness and weakness

## 2021-04-14 ENCOUNTER — Other Ambulatory Visit: Payer: Self-pay | Admitting: Internal Medicine

## 2021-04-15 ENCOUNTER — Other Ambulatory Visit: Payer: Self-pay

## 2021-04-15 ENCOUNTER — Ambulatory Visit (INDEPENDENT_AMBULATORY_CARE_PROVIDER_SITE_OTHER): Payer: Medicare Other

## 2021-04-15 DIAGNOSIS — M818 Other osteoporosis without current pathological fracture: Secondary | ICD-10-CM

## 2021-04-15 MED ORDER — DENOSUMAB 60 MG/ML ~~LOC~~ SOSY
60.0000 mg | PREFILLED_SYRINGE | Freq: Once | SUBCUTANEOUS | Status: AC
  Administered 2021-04-15: 60 mg via SUBCUTANEOUS

## 2021-04-15 NOTE — Progress Notes (Addendum)
Prolia injection given w/o any complications.  Medical screening examination/treatment/procedure(s) were performed by non-physician practitioner and as supervising physician I was immediately available for consultation/collaboration.  I agree with above. Aleksei Plotnikov, MD 

## 2021-04-16 ENCOUNTER — Other Ambulatory Visit (HOSPITAL_BASED_OUTPATIENT_CLINIC_OR_DEPARTMENT_OTHER): Payer: Self-pay

## 2021-04-16 MED ORDER — COVID-19MRNA BIVAL VACC PFIZER 30 MCG/0.3ML IM SUSP
INTRAMUSCULAR | 0 refills | Status: DC
  Filled 2021-04-16: qty 0.3, 1d supply, fill #0

## 2021-04-18 ENCOUNTER — Ambulatory Visit: Payer: Medicare Other | Admitting: Occupational Therapy

## 2021-04-18 NOTE — Telephone Encounter (Signed)
Last Prolia inj 04/15/21 Next Prolia inj due 10/15/21 

## 2021-04-25 ENCOUNTER — Ambulatory Visit: Payer: Medicare Other | Admitting: Occupational Therapy

## 2021-05-03 ENCOUNTER — Ambulatory Visit: Payer: Medicare Other | Attending: Internal Medicine | Admitting: Occupational Therapy

## 2021-05-03 ENCOUNTER — Other Ambulatory Visit: Payer: Self-pay

## 2021-05-03 ENCOUNTER — Encounter: Payer: Self-pay | Admitting: Occupational Therapy

## 2021-05-03 DIAGNOSIS — M6281 Muscle weakness (generalized): Secondary | ICD-10-CM | POA: Insufficient documentation

## 2021-05-03 DIAGNOSIS — M79641 Pain in right hand: Secondary | ICD-10-CM | POA: Diagnosis not present

## 2021-05-03 DIAGNOSIS — M79642 Pain in left hand: Secondary | ICD-10-CM | POA: Diagnosis present

## 2021-05-05 NOTE — Therapy (Signed)
[facility] [address] [place], [place], [address] Phone: [phone]   Fax:  [phone]  Occupational Therapy Treatment  Patient Details  Name: Julie Richard MRN: [id number] Date of Birth: 02/22/1946 Referring Provider (OT): Sheliah Hatch, MD (Rheumatology)   Encounter Date: 05/03/2021   OT End of Session - 05/03/21 0935     Visit Number 2   Number of Visits 4   Date for OT Re-Evaluation 06/03/21   Authorization Type Medicare (primary); BCBS (secondary)   Authorization Time Period VL: [place]   OT Start Time 0930   OT Stop Time 1012   OT Time Calcuation (min) 42 min   Activity Tolerance Patient tolerated treatment well    Behavior During Therapy [facility] for tasks assessed/performed           Past Medical History:  Diagnosis Date   Chicken pox    Colon polyps    Hyperlipidemia    Osteoporosis    Temporal arteritis (HCC)    Thyroid disorder     Past Surgical History:  Procedure Laterality Date   ANKLE SURGERY     CATARACT EXTRACTION     TONSILLECTOMY AND ADENOIDECTOMY     TUBAL LIGATION      There were no vitals filed for this visit.   Subjective Assessment - 05/03/21 0932    Subjective Pt reports a "stinging" sensation when pinching along length of putty, primarily around PIP of R long finger   Pertinent History Bilateral hand osteoarthritis; PMH includes HLD, temporal arteritis, hx of cataracts/corrective surgery, osteoporosis, DJD, and dyslipidemia    Patient Stated Goals "Being able to open a bottle of water"   Currently in Pain? Yes   Pain Score 1   Pain Location Hand   Pain Orientation Right   Pain Descriptors / Indicators Aching   Pain Type Chronic pain   Pain Onset More than a month ago   Pain Frequency Constant            Treatment/Exercises - 05/03/21    Putty Full gross grasp of putty w/ R hand, focusing on attempting movement pattern against resistance opposed to straining to  force movement; completed 25x w/out difficulty. During exercise, pt reported discomfort in her palm, slightly proximal to long finger MPJ, but was able to complete activity within tolerable level of discomfort.    Thumb-to-finger opposition completed 5x along length of rolled putty w/ R hand index, long, and ring/little fingers; pt demo'd appropriate pad-to-pad prehension and reported "stinging" sensation around long finger PIPJ, which pt reported was tolerable and improved w/ repetition   BUE 3-pt pinch-and-pull of putty for intrinsic hand/lumbrical strengthening completed 10x w/out difficulty    Hand Exercises OT facilitated gentle self-PROM of R hand MPJ extension and isolated MP flexion of index and long fingers due to tightness and discomfort on palmar side of hand            OT Education- 05/03/21 1212    Education Details OT problem-solved w/ pt to identify pt-specific compensatory strategies, including AE for most important/frequent daily tasks; discussed adaptive bottle opener, benefit for rubber, non-slip grip pad, and built-up utensils w/ OT providing corresponding handouts for self-purchase prn. Also introduced HEP designed for strengthening and ROM (MedBridge handout administered)   Person(s) Education Patient   Methods Explanation;Handout;Demonstration   Comprehension Verbalized understanding;Returned demonstration            OT Short Term Goals - 05/03/21 3151  OT SHORT TERM GOAL #1   Title Pt will be able to open at least 5 various bottles and containers w/ Mod I, using AE prn, reporting pain less than 2/10    Baseline Reports "quite a bit of difficulty" w/ opening jars on UEFI    Time 2    Period Weeks    Status On-going    Target Date 04/19/21      OT SHORT TERM GOAL #2   Title Pt will improve grip strength of R, dominant hand by at least 4 lbs w/ pain less than 2/10    Baseline R hand 23# (L hand 34#)    Time 3    Period Weeks    Status On-going     Target Date 04/26/21      OT SHORT TERM GOAL #3   Title Pt will demonstrate independence w/ wear and care of orthosis, if needed    Baseline No brace/orthosis at this time    Time 3    Period Weeks    Status On-going    Target Date 04/26/21             Plan - 05/03/21 0936    Clinical Impression Statement Due to decreased R hand grip strength (dominant side) noted during evaluation session, OT introduced light hand strengthening using putty w/ red (med-soft) putty administered to pt. Pt did report tolerable level of discomfort during gross grasp and thumb opposition putty exercises and was instucted to d/c exercises if pain increased, became persistent, or worsened; pt verbalized understanding. OT did palpate tightness in area where pt identified discomfort (R long finger PIP and MPJ), which could indicate pain due to stiffness/decreased ROM, decreased strength, and/or condition-related pain. Considering this, pt may benefit from additional support (compression, orthosis), which will be addressed next session. Due to demonstrated understanding of HEP and compensatory strategies, as well as pt's request due to scheduling, potential discharge next session may be appropriate.   OT Occupational Profile and History Problem Focused Assessment - Including review of records relating to presenting problem    Occupational performance deficits (Please refer to evaluation for details): ADL's;IADL's;Rest and Sleep;Leisure    Body Structure / Function / Physical Skills ADL;Decreased knowledge of precautions;UE functional use;Body mechanics;Dexterity;Sensation;Edema;Strength;Pain;IADL    Rehab Potential Fair   Target Date 04/19/21    Clinical Decision Making Limited treatment options, no task modification necessary    Comorbidities Affecting Occupational Performance: May have comorbidities impacting occupational performance    Modification or Assistance to Complete Evaluation  No modification of tasks or  assist necessary to complete eval    OT Frequency 1x / week   OT Duration 2 weeks   OT Treatment/Interventions Self-care/ADL training;Therapeutic exercise;Patient/family education;Splinting;Compression bandaging;Paraffin;Moist Heat;Fluidtherapy;Therapeutic activities;Manual Therapy;DME and/or AE instruction;Ultrasound;Energy conservation    Plan Re-assess goals in anticipation for d/c; review HEP and update if needed; discuss compression gloves/prefab wrist/hand support/orthosis   Consulted and Agree with Plan of Care Patient           Patient will benefit from skilled therapeutic intervention in order to improve the following deficits and impairments:   Body Structure / Function / Physical Skills: ADL, Decreased knowledge of precautions, UE functional use, Body mechanics, Dexterity, Sensation, Edema, Strength, Pain, IADL   Visit Diagnosis: Pain in right hand  Pain in left hand  Muscle weakness (generalized)   Problem List Patient Active Problem List   Diagnosis Date Noted   Bilateral hand pain 04/02/2021   Hyperlipidemia 02/06/2021   Thyrotoxicosis  with diffuse goiter without thyrotoxic crisis or storm 02/06/2021   Vitamin D deficiency 02/06/2021   High risk medication use 02/06/2021   Rash 10/24/2020   Breast nodule 10/24/2020   OAB (overactive bladder) 08/01/2020   Vitamin D toxicity 04/11/2020   Situational anxiety 02/23/2020   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   Coronary atherosclerosis 02/23/2020   Drusen (degenerative) of retina, bilateral 10/21/2019   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 [place], [place], [address] Phone: [phone]   Fax:  [phone]  Name: Julie Richard MRN: [id number] Date of Birth: 1946-03-28

## 2021-05-15 ENCOUNTER — Encounter: Payer: Self-pay | Admitting: Occupational Therapy

## 2021-05-15 ENCOUNTER — Ambulatory Visit: Payer: Medicare Other | Attending: Internal Medicine | Admitting: Occupational Therapy

## 2021-05-15 ENCOUNTER — Other Ambulatory Visit: Payer: Self-pay

## 2021-05-15 DIAGNOSIS — M79642 Pain in left hand: Secondary | ICD-10-CM

## 2021-05-15 DIAGNOSIS — M79641 Pain in right hand: Secondary | ICD-10-CM | POA: Diagnosis present

## 2021-05-15 DIAGNOSIS — M6281 Muscle weakness (generalized): Secondary | ICD-10-CM | POA: Diagnosis present

## 2021-05-15 NOTE — Patient Instructions (Signed)
Thumb Adduction (Resistive Putty)    Press putty with thumb against index finger. Keep all fingers straight. Repeat 10-15 times; 1-2 sets.  Copyright  VHI. All rights reserved.

## 2021-05-16 NOTE — Therapy (Signed)
[place] [place], [place], [address] Phone: [phone]   Fax:  [phone]  Occupational Therapy Treatment & Discharge Summary  Patient Details  Name: Julie Richard MRN: [id number] Date of Birth: Mar 31, 1946 Referring Provider (OT): Vernelle Emerald, MD (Rheumatology)   Encounter Date: 05/15/2021   OT End of Session - 05/15/21 1321     Visit Number 3    Number of Visits 4    Date for OT Re-Evaluation 06/03/21    Authorization Type Medicare (primary); BCBS (secondary)    Authorization Time Period VL: [place]    OT Start Time 5462    OT Stop Time 135   OT Time Calculation (min) 42 min    Activity Tolerance Patient tolerated treatment well    Behavior During Therapy [facility] for tasks assessed/performed            OCCUPATIONAL THERAPY DISCHARGE SUMMARY  Visits from Start of Care: 3  Current functional level related to goals / functional outcomes: Pt is able to open most bottles/jars/containers w/ Mod I and decreased pain, per pt report, and additionally verbalizes understanding of recommended compression glove. Grip strength of R hand (dominant side) approximately 10 lbs less than L hand (34 lbs).   Remaining deficits: Pt continues to report pain in her hands, primarily in R long finger MP/PIPJs, and demonstrated decreased grip and pinch strength   Education / Equipment: Joint protection strategies; pain management; condition-specific education; compensatory strategies for ADLs, including AE prn (bottle opener, non-slip grip pad/mat, built-up handle utensils); HEP designed for hand strengthening and gentle stretching   Patient agrees to discharge. Patient goals were partially met. Patient is being discharged due to the patient's request.    Past Medical History:  Diagnosis Date   Chicken pox    Colon polyps    Hyperlipidemia    Osteoporosis    Temporal arteritis ([place])    Thyroid disorder     Past  Surgical History:  Procedure Laterality Date   ANKLE SURGERY     CATARACT EXTRACTION     TONSILLECTOMY AND ADENOIDECTOMY     TUBAL LIGATION      There were no vitals filed for this visit.   Subjective Assessment - 05/15/21 1319     Subjective  Pt reports that the non-slip pads provided in prior session have been very helpful when opening various bottles/containers at home    Pertinent History Bilateral hand osteoarthritis; PMH includes HLD, temporal arteritis, hx of cataracts/corrective surgery, osteoporosis, DJD, and dyslipidemia    Patient Stated Goals "Being able to open a bottle of water"    Currently in Pain? Yes    Pain Score 1     Pain Location Finger (Comment which one)   R long finger and thumb   Pain Orientation Right    Pain Descriptors / Indicators Aching    Pain Type Chronic pain    Pain Onset More than a month ago    Pain Frequency --             Treatment/Exercises - 05/15/21    Putty Lateral pinch w/ putty for thumb and intrinsic hand strengthening. Due to reported pain in R long finger PIPJ, activity was modified to thumb adduction w/ digits 2-5 extended; completed 10x slowly w/ R hand w/out additional pain    Joint Protection Strategies Discussed options to incorporate joint protection techniques and compensatory strategies for various IADL tasks including carrying grocery bags and making the bed  due to pt's report of pain when completing activities at home            OT Education - 05/15/21 1414     Education Details Provided continued condition-specific education and discussed edema/compression glove and hand immobilization brace options to rest affected anatomic structures and assist w/ pain management; handout w/ options for self-purchase provided.    Person(s) Educated Patient    Methods Explanation;Handout    Comprehension Verbalized understanding             OT Short Term Goals - 05/15/21 1421       OT SHORT TERM GOAL #1   Title Pt will  be able to open at least 5 various bottles and containers w/ Mod I, using AE prn, reporting pain less than 2/10    Baseline Reports "quite a bit of difficulty" w/ opening jars on UEFI    Time 2    Period Weeks    Status Achieved   05/15/21 - able to open all containers/bottles at home w/ Mod I, per pt report   Target Date 04/19/21      OT SHORT TERM GOAL #2   Title Pt will improve grip strength of R, dominant hand by at least 4 lbs w/ pain less than 2/10    Baseline R hand 23# (L hand 34#)    Time 3    Period Weeks    Status Not Met   05/15/21 - limited by pain in R hand   Target Date 04/26/21      OT SHORT TERM GOAL #3   Title Pt will demonstrate independence w/ wear and care of orthosis, if needed    Baseline No brace/orthosis at this time    Time 3    Period Weeks    Status Achieved   05/15/21 - no custom orthosis needed; verbalizes understanding of prefabricated brace/compression glove   Target Date 04/26/21             Plan - 05/15/21 1322     Clinical Impression Statement Mrs. Frickey is a 75 y/o female who has been seen in OP OT for bilateral hand osteoarthritis, which is more prominent/limiting in the R hand (dominant side) than in the the L hand. Pt has shown improvements in understanding and use of joint protection techniques and compensatory strategies, including AE, w/ 2/3 STGs met. In preparation for requested d/c during previous session, OT reviewed learned strategies w/ pt and introduced lateral pinch exercise w/ putty due to pt's report of difficulty pulling the sheets up when making the bed. Pt demonstrated using lateral pinch and pulling sheets using UE abduction and external rotation; OT discussed modification options and pt verbalized understanding. Pt is appropriate for d/c from skilled OT to HEP at this time and reports she is satisfied with her progress. OT encouraged pt to call back and/or return to skilled occupational therapy if she has further concerns or functional  limitations she would like to address.    OT Occupational Profile and History Problem Focused Assessment - Including review of records relating to presenting problem    Occupational performance deficits (Please refer to evaluation for details): ADL's;IADL's;Rest and Sleep;Leisure    Body Structure / Function / Physical Skills ADL;Decreased knowledge of precautions;UE functional use;Body mechanics;Dexterity;Sensation;Edema;Strength;Pain;IADL    Rehab Potential Fair    Clinical Decision Making Limited treatment options, no task modification necessary    Comorbidities Affecting Occupational Performance: May have comorbidities impacting occupational performance    Modification  or Assistance to Complete Evaluation  No modification of tasks or assist necessary to complete eval    OT Frequency 1x / week    OT Duration 2 weeks    OT Treatment/Interventions Self-care/ADL training;Therapeutic exercise;Patient/family education;Splinting;Compression bandaging;Paraffin;Moist Heat;Fluidtherapy;Therapeutic activities;Manual Therapy;DME and/or AE instruction;Ultrasound;Energy conservation    Plan D/C    Consulted and Agree with Plan of Care Patient            Patient will benefit from skilled therapeutic intervention in order to improve the following deficits and impairments:   Body Structure / Function / Physical Skills: ADL, Decreased knowledge of precautions, UE functional use, Body mechanics, Dexterity, Sensation, Edema, Strength, Pain, IADL   Visit Diagnosis: Pain in right hand  Pain in left hand  Muscle weakness (generalized)   Problem List Patient Active Problem List   Diagnosis Date Noted   Bilateral hand pain 04/02/2021   Hyperlipidemia 02/06/2021   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 02/06/2021   Vitamin D deficiency 02/06/2021   High risk medication use 02/06/2021   Rash 10/24/2020   Breast nodule 10/24/2020   OAB (overactive bladder) 08/01/2020    Vitamin D toxicity 04/11/2020   Situational anxiety 02/23/2020   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 [place], [place], [address] Phone: [phone]   Fax:  [phone]  Name: Maicie Vanderloop MRN: [id number] Date of Birth: February 25, 1946

## 2021-06-24 ENCOUNTER — Telehealth: Payer: Self-pay | Admitting: Internal Medicine

## 2021-06-24 NOTE — Telephone Encounter (Signed)
Called patient to schedule AWV, patient wants to wait til after Jan to schedule. Patient will call back to schedule when she is ready.

## 2021-07-14 ENCOUNTER — Other Ambulatory Visit: Payer: Self-pay | Admitting: Internal Medicine

## 2021-08-20 ENCOUNTER — Telehealth: Payer: Self-pay | Admitting: Internal Medicine

## 2021-08-20 NOTE — Telephone Encounter (Signed)
Hey Dr. Marina Goodell,   We received a referral in our office for colonoscopy. Patient last procedure was in 2017 in [place]. I have records for review. As DOD, (10/18PM), could you please review and advise on scheduling?  Thank you

## 2021-08-21 NOTE — Telephone Encounter (Signed)
GI ATTENDING  Courtesy outside records reviewed.  Referred by Dr. Wylene Simmer for colonoscopy.  Records indicate last colonoscopy in [facility] in 2017.  Do not have a copy of the colonoscopy report.  Records also indicate family history of colon (rectal) cancer for which 5-year follow-up was recommended.  Okay for previsit and direct colonoscopy in [facility] with Dr. Marina Goodell.  Wilhemina Bonito. Eda Keys., M.D. [facility] Division of Gastroenterology

## 2021-08-28 ENCOUNTER — Encounter: Payer: Self-pay | Admitting: Internal Medicine

## 2021-08-28 NOTE — Telephone Encounter (Signed)
Left vm to return call to schedule appt 

## 2021-09-04 ENCOUNTER — Other Ambulatory Visit: Payer: Self-pay | Admitting: Internal Medicine

## 2021-10-08 ENCOUNTER — Ambulatory Visit (AMBULATORY_SURGERY_CENTER): Payer: Medicare Other | Admitting: *Deleted

## 2021-10-08 VITALS — Ht 61.0 in | Wt 107.0 lb

## 2021-10-08 DIAGNOSIS — Z800 Family history of malignant neoplasm of digestive organs: Secondary | ICD-10-CM

## 2021-10-08 MED ORDER — PLENVU 140 G PO SOLR: 1.0000 | ORAL | 0 refills | Status: AC

## 2021-10-08 NOTE — Progress Notes (Signed)
No egg or soy allergy known to patient  ?No issues known to pt with past sedation with any surgeries or procedures ?Patient denies ever being told they had issues or difficulty with intubation  ?No FH of Malignant Hyperthermia ?Pt is not on diet pills ?Pt is not on  home 02  ?Pt is not on blood thinners  ?Pt denies issues with constipation  ?No A fib or A flutter ? ?Plenvu Coupon to pt in PV today , Code to Pharmacy and  NO PA's for preps discussed with pt In PV today  ?Discussed with pt there will be an out-of-pocket cost for prep and that varies from $0 to 70 +  dollars - pt verbalized understanding  ? ?Due to the COVID-19 pandemic we are asking patients to follow certain guidelines in PV and the [facility]   ?Pt aware of COVID protocols and [facility] guidelines  ? ?PV completed over the phone. Pt verified name, DOB, address and insurance during PV today.  ?Pt mailed instruction packet with copy of consent form to read and not return, and instructions.  ?Pt encouraged to call with questions or issues.  ?If pt has My chart, procedure instructions sent via My Chart   ?

## 2021-10-15 IMAGING — DX DG CHEST 1V
1 series · 1 of 1 positions shown · non-contrast
Comparison: 05/03/2020

CLINICAL DATA: Chest pain

EXAM:
CHEST  1 VIEW

[chest pa]
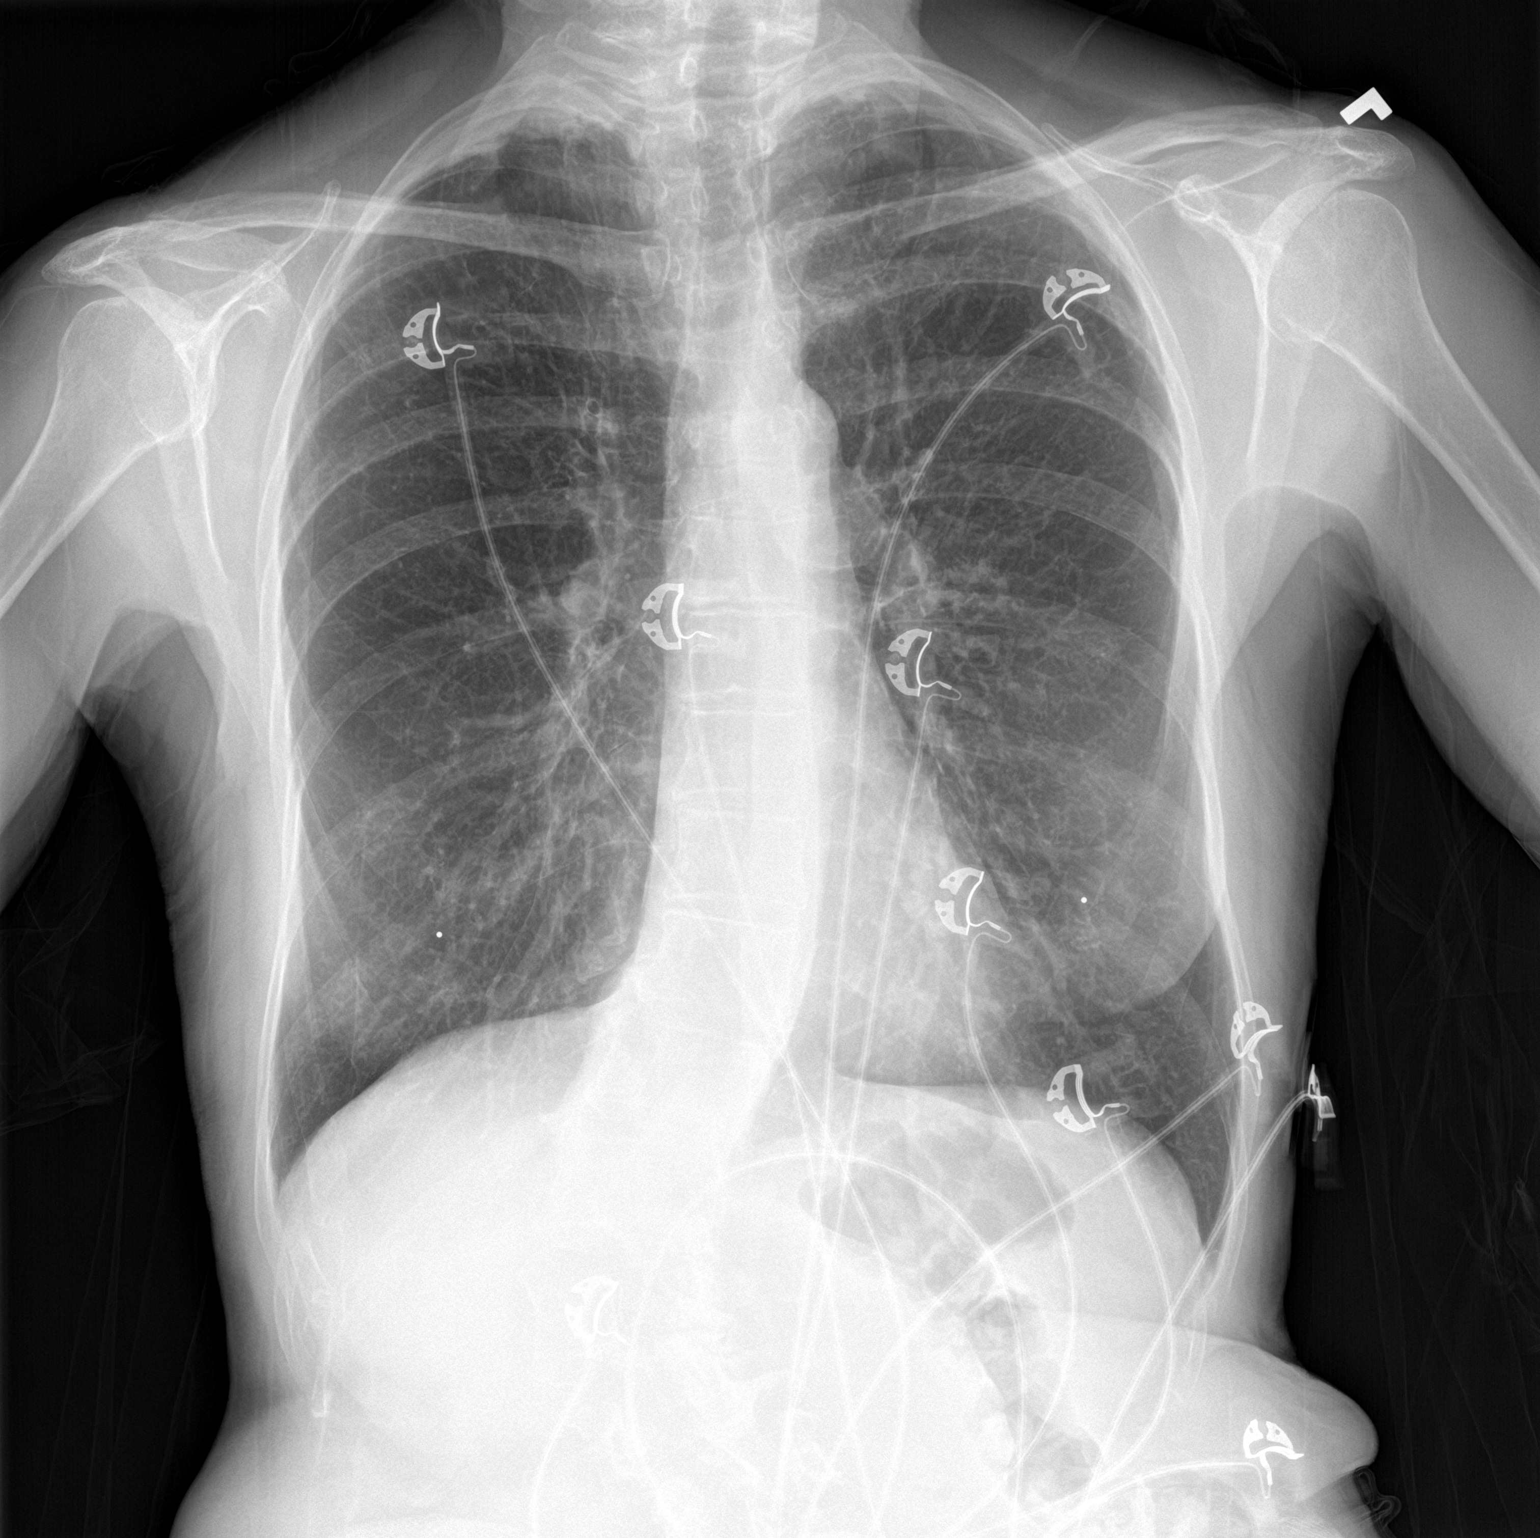

[1 of 1 positions shown; findings below may reference images not displayed]

FINDINGS: Repeat frontal view chest with nipple markers. Previously noted
nodular opacities correspond to the nipples. No focal opacity or
pleural effusion. Normal cardiomediastinal silhouette. No
pneumothorax. Scoliosis of the spine.
IMPRESSION: Negative.

## 2021-10-18 ENCOUNTER — Other Ambulatory Visit: Payer: Self-pay | Admitting: Internal Medicine

## 2021-10-21 ENCOUNTER — Telehealth: Payer: Self-pay | Admitting: *Deleted

## 2021-10-21 NOTE — Telephone Encounter (Signed)
Rec'd benefit verification form from [employer] f\or pt prolia. Verification date 10/16/21. Copay $225. Will forward top Stevenson Clinch, CMA who handles Prolia..../lmb ?

## 2021-10-22 ENCOUNTER — Encounter: Payer: Medicare Other | Admitting: Internal Medicine

## 2021-10-22 NOTE — Telephone Encounter (Signed)
Deatra James, RMA 21 hours ago (2:33 PM)  ? ?Rec'd benefit verification form from [employer] f\or pt prolia. Verification date 10/16/21. Copay $225. Will forward top Stevenson Clinch, CMA who handles [employer]..../lmb  ?  ? ?

## 2021-10-24 NOTE — Telephone Encounter (Signed)
Pt ready for scheduling on or after 10/18/21 ? ?Out-of-pocket cost due at time of visit: $0 ? ?Primary: Medicare ?Prolia co-insurance: 20% (approximately $276) ?Admin fee co-insurance: 20% (approximately $25) ? ?Secondary: BCBS [place] Medicare Supp ?Prolia co-insurance:Covers Medicare Part B co-insurance ?Admin fee co-insurance: Covers Medicare Part B co-insurance  ? ?Deductible: $0 of $226 met (covered by secondary) ? ?Prior Auth: not required ?PA# ?Valid:  ? ?** This summary of benefits is an estimation of the patient's out-of-pocket cost. Exact cost may vary based on individual plan coverage.  ? ?

## 2021-10-28 IMAGING — CT CT CHEST W/O CM
2 of 4 series · 15 of 36 positions shown, 18 images · non-contrast
Comparison: Left rib x-rays dated May 11, 2020. Cardiac CT
dated November 29, 2019.

CLINICAL DATA: Left-sided chest pain radiating around the back for
the past 2 weeks.

EXAM:
CT CHEST WITHOUT CONTRAST
TECHNIQUE: Multidetector CT imaging of the chest was performed following the
standard protocol without IV contrast.

[Series 2: thorax · axial · 0.66mm/px · z∈[-363,-85]mm · 12 of 163 slices shown, 15 images]
[im 12/163  mediastinal]
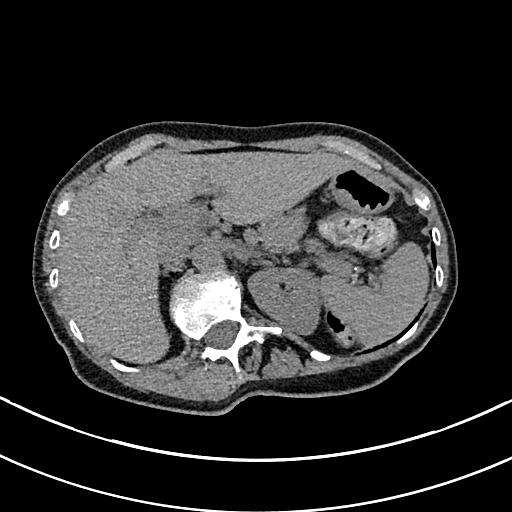
[im 12/163  lung]
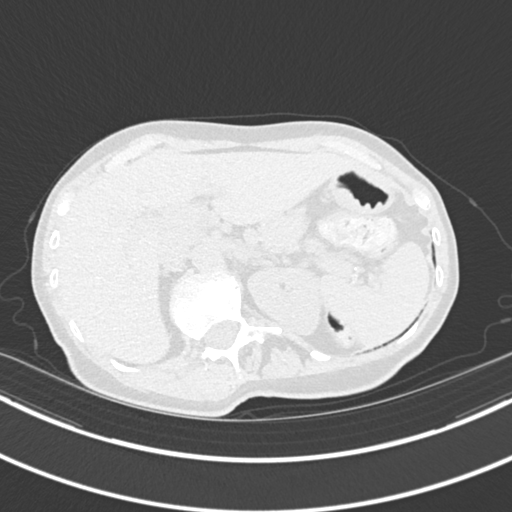
[im 24/163  lung]
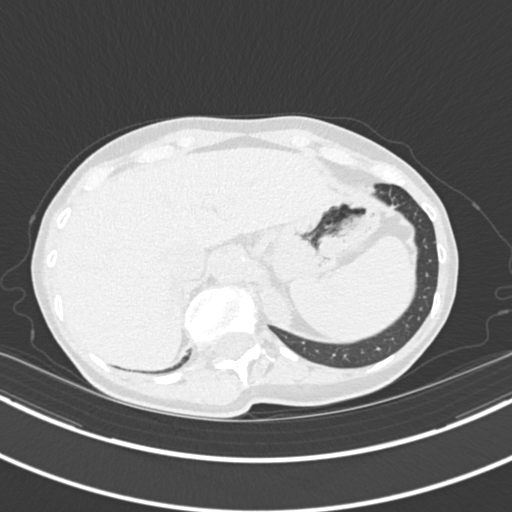
[im 35/163  lung]
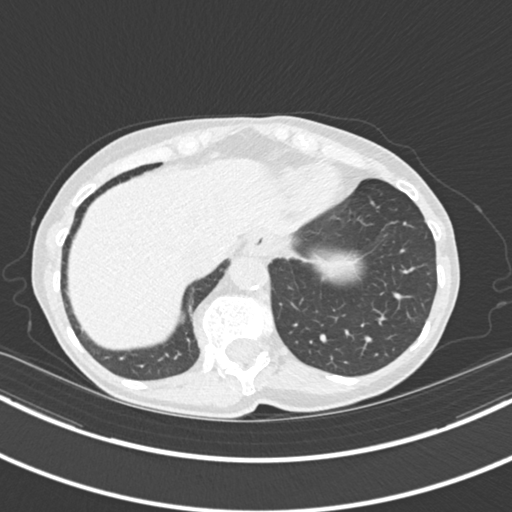
[im 47/163  lung]
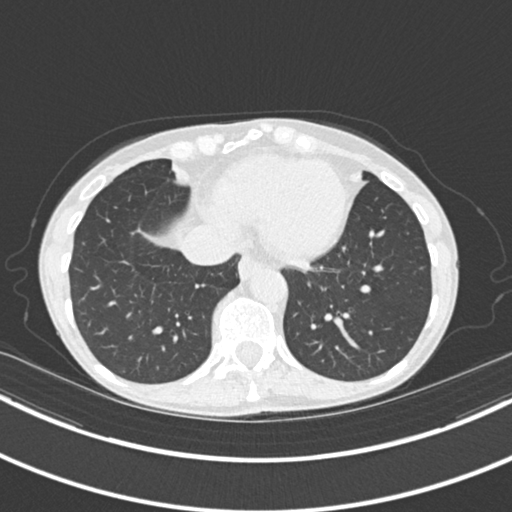
[im 58/163  mediastinal]
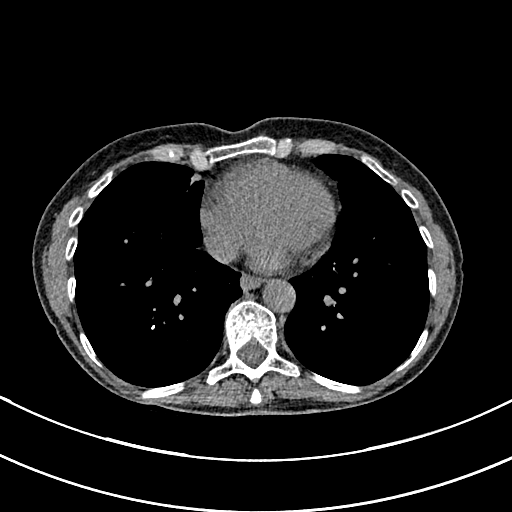
[im 58/163  lung]
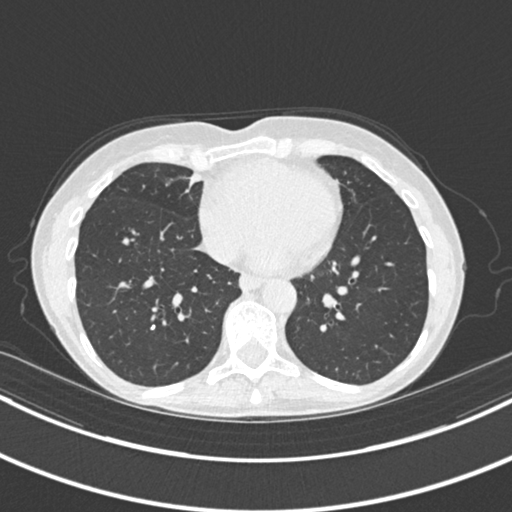
[im 70/163  lung]
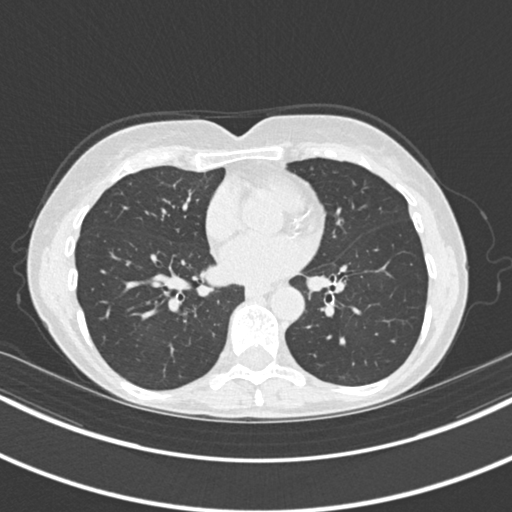
[im 93/163  lung]
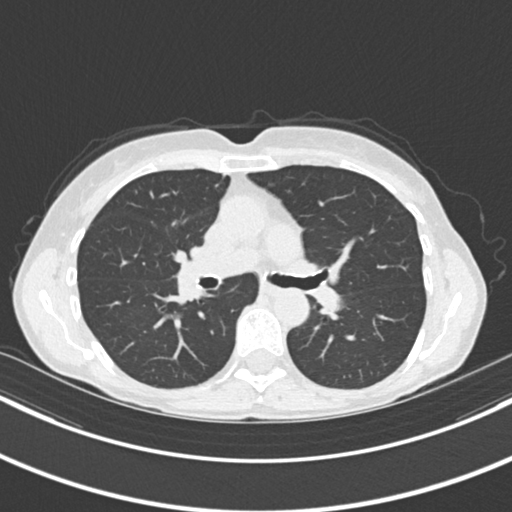
[im 105/163  lung]
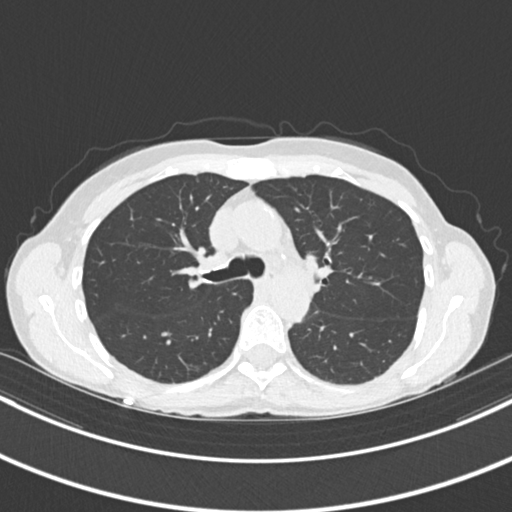
[im 116/163  mediastinal]
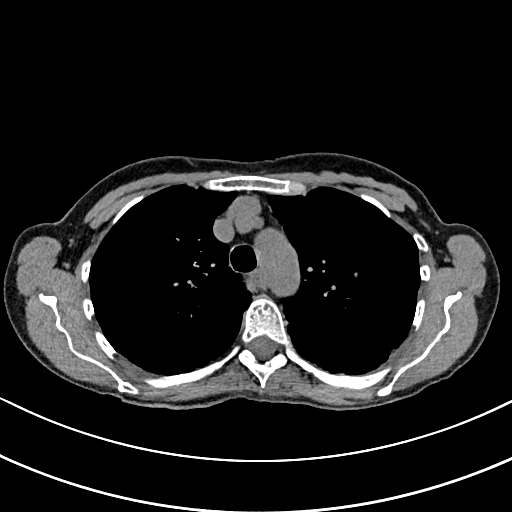
[im 116/163  lung]
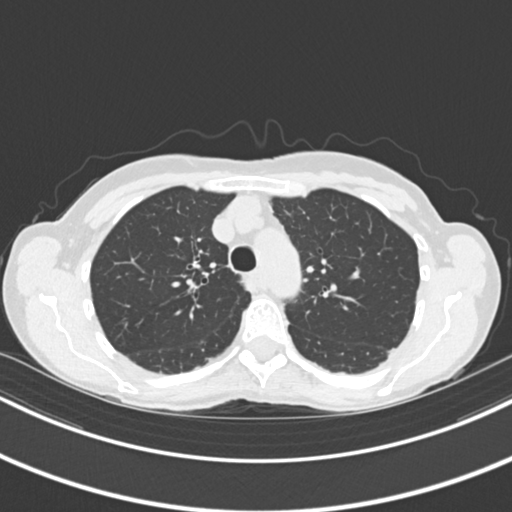
[im 128/163  lung]
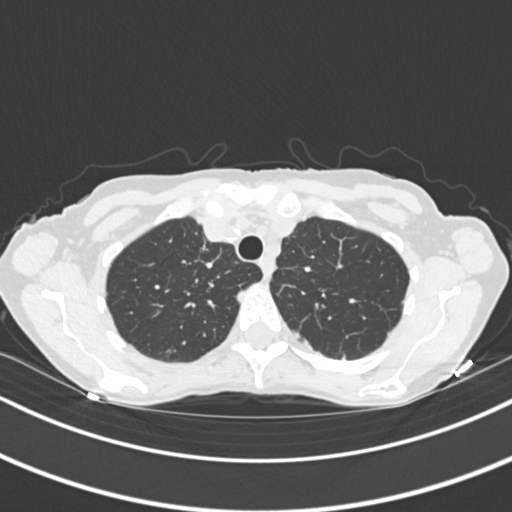
[im 139/163  lung]
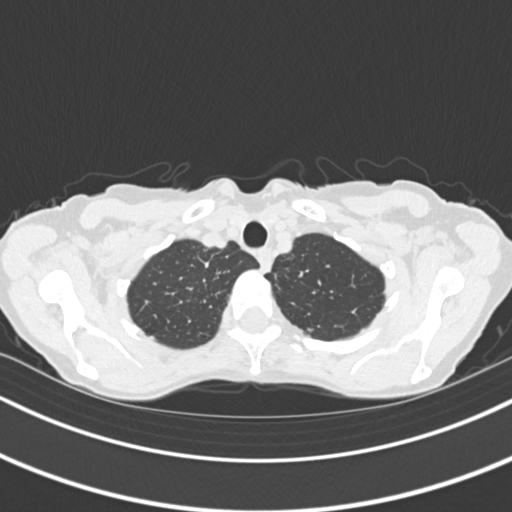
[im 151/163  lung]
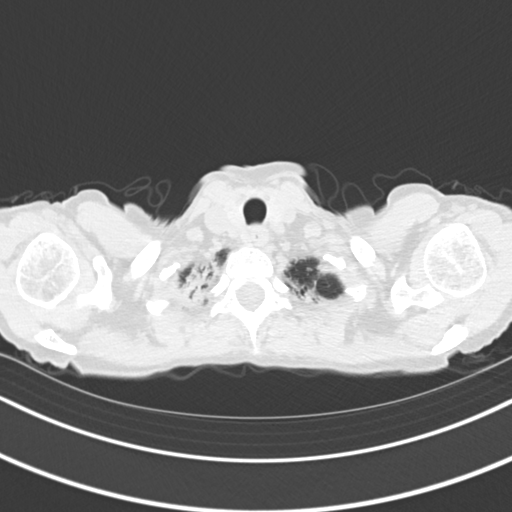

[Series 5: coronal · coronal · 0.63mm/px · 3 of 105 slices shown]
[im 21/105  lung]
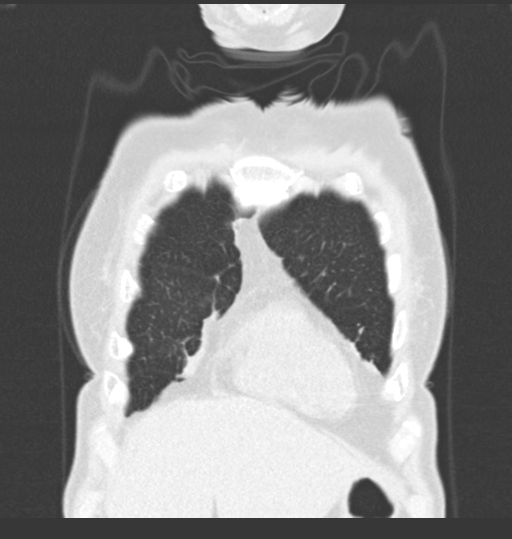
[im 42/105  lung]
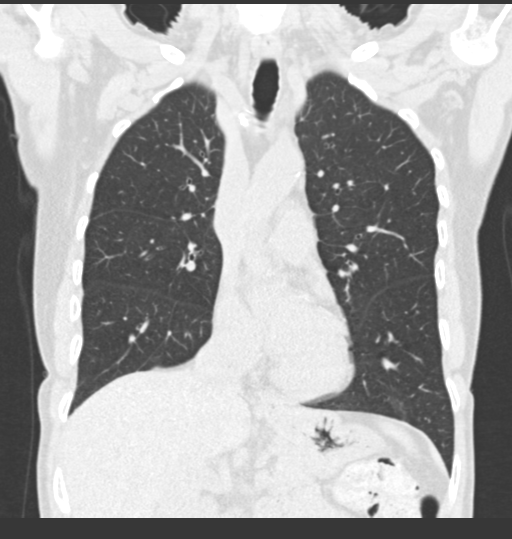
[im 63/105  lung]
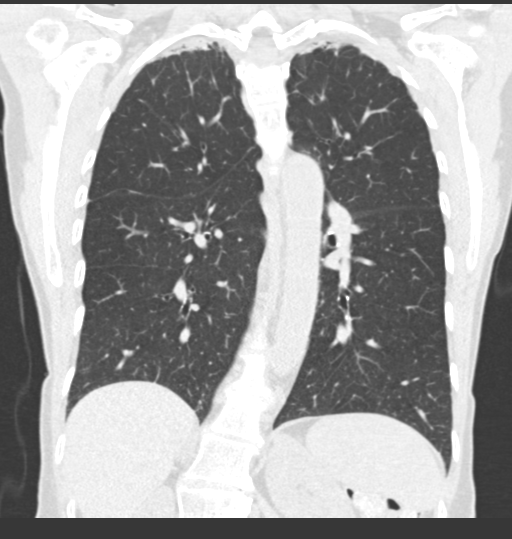

[15 of 36 positions shown; findings below may reference images not displayed]

FINDINGS: Cardiovascular: No significant vascular findings. Normal heart size.
No pericardial effusion. No thoracic aortic aneurysm. Coronary,
aortic arch, and branch vessel atherosclerotic vascular disease.

Mediastinum/Nodes: No enlarged mediastinal or axillary lymph nodes.
Thyroid gland, trachea, and esophagus demonstrate no significant
findings.

Lungs/Pleura: Two 4 mm nodules in the left lower lobe are unchanged
(series 3, images 103 and 109). Biapical pleuroparenchymal scarring.
Mild scarring in the medial right middle lobe and inferior lingula
again noted. No focal consolidation, pleural effusion, or
pneumothorax.

Upper Abdomen: No acute abnormality.

Musculoskeletal: Subacute appearing nondisplaced fracture of the
posterior left sixth rib (series 3, image 51).
IMPRESSION: 1. No acute intrathoracic process.
2. Subacute appearing nondisplaced fracture of the posterior left
sixth rib.
3. Two 4 mm left lower lobe pulmonary nodules are unchanged [REDACTED]. No follow-up needed if patient is low-risk (and has no known or
suspected primary neoplasm). Non-contrast chest CT can be considered
in 6 months if patient is high-risk. This recommendation follows the
consensus statement: Guidelines for Management of Incidental
Pulmonary Nodules Detected on CT Images: From the [HOSPITAL]
4. Aortic Atherosclerosis ([id number]-ZK7.7).

## 2021-11-01 ENCOUNTER — Other Ambulatory Visit (HOSPITAL_COMMUNITY): Payer: Self-pay | Admitting: *Deleted

## 2021-11-04 ENCOUNTER — Ambulatory Visit (HOSPITAL_COMMUNITY)
Admission: RE | Admit: 2021-11-04 | Discharge: 2021-11-04 | Disposition: A | Discharge status: Home / Self Care | Payer: Medicare Other | Source: Ambulatory Visit | Attending: Internal Medicine | Admitting: Internal Medicine

## 2021-11-04 DIAGNOSIS — M810 Age-related osteoporosis without current pathological fracture: Secondary | ICD-10-CM | POA: Diagnosis not present

## 2021-11-04 MED ORDER — DENOSUMAB 60 MG/ML ~~LOC~~ SOSY
PREFILLED_SYRINGE | SUBCUTANEOUS | Status: AC
  Filled 2021-11-04: qty 1

## 2021-11-04 MED ORDER — DENOSUMAB 60 MG/ML ~~LOC~~ SOSY
60.0000 mg | PREFILLED_SYRINGE | Freq: Once | SUBCUTANEOUS | Status: AC
  Administered 2021-11-04: 60 mg via SUBCUTANEOUS

## 2021-11-05 ENCOUNTER — Ambulatory Visit
Admission: EM | Admit: 2021-11-05 | Discharge: 2021-11-05 | Disposition: A | Discharge status: Home / Self Care | Payer: Medicare Other | Attending: Emergency Medicine | Admitting: Emergency Medicine

## 2021-11-05 DIAGNOSIS — J310 Chronic rhinitis: Secondary | ICD-10-CM

## 2021-11-05 DIAGNOSIS — H6122 Impacted cerumen, left ear: Secondary | ICD-10-CM | POA: Diagnosis not present

## 2021-11-05 DIAGNOSIS — H6981 Other specified disorders of Eustachian tube, right ear: Secondary | ICD-10-CM | POA: Diagnosis not present

## 2021-11-05 DIAGNOSIS — J329 Chronic sinusitis, unspecified: Secondary | ICD-10-CM | POA: Diagnosis not present

## 2021-11-05 MED ORDER — FLUTICASONE PROPIONATE 50 MCG/ACT NA SUSP: 1.0000 | Freq: Every day | NASAL | 1 refills | Status: AC

## 2021-11-05 NOTE — ED Triage Notes (Signed)
Pt c/o clogging sensation to her right ear that began yesterday. ?

## 2021-11-05 NOTE — ED Provider Notes (Signed)
?[facility] ? ? ? ?CSN: [id number] ?Arrival date & time: 11/05/21  0903 ?  ? ?HISTORY  ? ?Chief Complaint  ?Patient presents with  ? Ear Fullness  ? ?HPI ?Julie Richard is a 75 y.o. female. Patient reports her right ear began to feel clogged yesterday, denies hearing loss but states she can hear a pulsing sensation in her ear which she finds troubling.  Patient states she actually feels a little guilty coming in here today but wanted to find out what is going on in her right ear.  Patient denies using Q-tips to clean her ears.  Patient denies a history of allergies but does endorse use of Mucinex or over-the-counter cold flu preparations at bedtime to help her sleep at night.  Patient states her husband was recently admitted to an Alzheimer's facility and she is having hard time sleeping alone at night. ? ?The history is provided by the patient.  ?Past Medical History:  ?Diagnosis Date  ? Chicken pox   ? Colon polyps   ? Hyperlipidemia   ? Osteoporosis   ? Temporal arteritis ([place])   ? Thyroid disorder   ? ?Patient Active Problem List  ? Diagnosis Date Noted  ? Bilateral hand pain 04/02/2021  ? Hyperlipidemia 02/06/2021  ? 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 Vitamin D deficiency 02/06/2021  ? High risk medication use 02/06/2021  ? Rash 10/24/2020  ? Breast nodule 10/24/2020  ? OAB (overactive bladder) 08/01/2020  ? Vitamin D toxicity 04/11/2020  ? Situational anxiety 02/23/2020  ? 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 Coronary atherosclerosis 02/23/2020  ? Drusen (degenerative) of retina, bilateral 10/21/2019  ? 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 Temporal arteritis ([place]) 06/21/2019  ? Osteoporosis 06/21/2019  ? Osteoarthritis 06/21/2019  ? Scoliosis 06/21/2019  ? Dyslipidemia 06/21/2019  ? Caregiver stress 02/02/2018  ? Idiopathic scoliosis of thoracolumbar spine 12/08/2016  ? History of temporal arteritis 02/23/2016  ? Colon polyps 01/18/2016  ? FH: colonic  polyps 01/18/2016  ? Dysphagia, pharyngeal phase 10/22/2015  ? Cataract 02/11/2013  ? ?Past Surgical History:  ?Procedure Laterality Date  ? ANKLE SURGERY    ? CATARACT EXTRACTION    ? TONSILLECTOMY AND ADENOIDECTOMY    ? TUBAL LIGATION    ? ?OB History   ? ? Gravida  ?2  ? Para  ?2  ? Term  ?   ? Preterm  ?   ? AB  ?   ? Living  ?   ?  ? ? SAB  ?   ? IAB  ?   ? Ectopic  ?   ? Multiple  ?   ? Live Births  ?   ?   ?  ?  ? ?Home Medications   ? ?Prior to Admission medications   ?Medication Sig Start Date End Date Taking? Authorizing Provider  ?acetaminophen (TYLENOL) 650 MG CR tablet Take 650 mg by mouth as needed for pain.    [provider]  ?Aspirin 81 MG CAPS 1 capsule    [provider]  ?aspirin 81 MG chewable tablet Chew 81 mg by mouth every other day.    [provider]  ?CALCIUM PO Take by mouth.    [provider]  ?Cholecalciferol 50 MCG (2000 UT) TABS 1000 iu 3/week - high levels in the past ?Patient taking differently: Take 1,000 Units by mouth every other day. 10/18/19   Plotnikov, Evie Lacks, MD  ?denosumab (PROLIA) 60  MG/ML SOSY injection Inject 60 mg into the skin every 6 (six) months.    [provider]  ?ibuprofen (ADVIL) 800 MG tablet Take by mouth. 05/29/21   [provider]  ?Multiple Vitamin (MULTI-VITAMIN) tablet Take by mouth.    [provider]  ?oxyCODONE-acetaminophen (PERCOCET/ROXICET) 5-325 MG tablet Take 1 tablet by mouth every 8 (eight) hours as needed for up to 5 doses for severe pain. 01/31/21   Luna Fuse, MD  ?PEG-KCl-NaCl-NaSulf-Na Asc-C (PLENVU) 140 g SOLR Take 1 kit by mouth as directed. 10/08/21   Irene Shipper, MD  ?rosuvastatin (CRESTOR) 10 MG tablet TAKE 1 TABLET(10 MG) BY MOUTH DAILY Keep follow-up appt for future refills 01/14/21   Plotnikov, Evie Lacks, MD  ?tolterodine (DETROL LA) 4 MG 24 hr capsule TAKE 1 CAPSULE(4 MG) BY MOUTH DAILY 07/15/21   Plotnikov, Evie Lacks, MD  ? ?Family History ?Family History  ?Problem  Relation Age of Onset  ? Alzheimer's disease Mother   ? Early death Father   ? Rectal cancer Sister   ? Cancer Sister   ?     Rectal cancer  ? Graves' disease Sister   ? Hyperlipidemia Brother   ? Diabetes Maternal Grandmother   ? Diabetes Maternal Grandfather   ? Healthy Son   ? Leukemia Son   ? Colon cancer Neg Hx   ? Colon polyps Neg Hx   ? Esophageal cancer Neg Hx   ? Stomach cancer Neg Hx   ? ?Social History ?Social History  ? ?Tobacco Use  ? Smoking status: Former  ? Smokeless tobacco: Never  ?Vaping Use  ? Vaping Use: Never used  ?Substance Use Topics  ? Alcohol use: Yes  ?  Comment: rarely  ? Drug use: Never  ? ?Allergies   ?Penicillins ? ?Review of Systems ?Review of Systems ?Pertinent findings noted in history of present illness.  ? ?Physical Exam ?Triage Vital Signs ?ED Triage Vitals  ?Enc Vitals Group  ?   BP 05/03/21 0827 (!) 147/82  ?   Pulse Rate 05/03/21 0827 72  ?   Resp 05/03/21 0827 18  ?   Temp 05/03/21 0827 98.3 ?F (36.8 ?C)  ?   Temp Source 05/03/21 0827 Oral  ?   SpO2 05/03/21 0827 98 %  ?   Weight --   ?   Height --   ?   Head Circumference --   ?   Peak Flow --   ?   Pain Score 05/03/21 0826 5  ?   Pain Loc --   ?   Pain Edu? --   ?   Excl. in [place]? --   ?No data found. ? ?Updated Vital Signs ?BP (!) 145/76 (BP Location: Right Arm)   Pulse 71   Temp 97.6 ?F (36.4 ?C) (Oral)   Resp 18   SpO2 98%  ? ?Physical Exam ?Vitals and nursing note reviewed.  ?Constitutional:   ?   General: She is not in acute distress. ?   Appearance: Normal appearance. She is not ill-appearing.  ?HENT:  ?   Head: Normocephalic and atraumatic.  ?   Salivary Glands: Right salivary gland is not diffusely enlarged or tender. Left salivary gland is not diffusely enlarged or tender.  ?   Right Ear: Hearing, ear canal and external ear normal. No drainage. A middle ear effusion (Clear) is present. There is no impacted cerumen. Tympanic membrane is bulging. Tympanic membrane is not injected or erythematous.  ?  Left Ear:  Hearing, ear canal and external ear normal. There is impacted cerumen.  ?   Nose: Mucosal edema and rhinorrhea present. No nasal deformity, septal deviation, signs of injury, nasal tenderness or congestion. Rhinorrhea is clear.  ?   Right Nostril: Occlusion present. No foreign body, epistaxis or septal hematoma.  ?   Left Nostril: Occlusion present. No foreign body, epistaxis or septal hematoma.  ?   Right Turbinates: Enlarged, swollen and pale.  ?   Left Turbinates: Enlarged, swollen and pale.  ?   Right Sinus: Frontal sinus tenderness present. No maxillary sinus tenderness.  ?   Left Sinus: Frontal sinus tenderness present. No maxillary sinus tenderness.  ?   Mouth/Throat:  ?   Lips: Pink. No lesions.  ?   Mouth: Mucous membranes are moist. No injury or oral lesions.  ?   Dentition: Normal dentition.  ?   Tongue: No lesions.  ?   Palate: No mass.  ?   Pharynx: Oropharynx is clear. Uvula midline. No pharyngeal swelling, oropharyngeal exudate, posterior oropharyngeal erythema or uvula swelling.  ?   Tonsils: No tonsillar exudate or tonsillar abscesses. 0 on the right. 0 on the left.  ?   Comments: ++ Postnasal drip ?Eyes:  ?   General: Lids are normal.     ?   Right eye: No discharge.     ?   Left eye: No discharge.  ?   Extraocular Movements: Extraocular movements intact.  ?   Conjunctiva/sclera: Conjunctivae normal.  ?   Right eye: Right conjunctiva is not injected.  ?   Left eye: Left conjunctiva is not injected.  ?Neck:  ?   Trachea: Trachea and phonation normal.  ?Cardiovascular:  ?   Rate and Rhythm: Normal rate and regular rhythm.  ?   Pulses: Normal pulses.  ?   Heart sounds: Normal heart sounds, S1 normal and S2 normal. No murmur heard. ?  No friction rub. No gallop.  ?Pulmonary:  ?   Effort: Pulmonary effort is normal. No tachypnea, bradypnea, accessory muscle usage, prolonged expiration, respiratory distress or retractions.  ?   Breath sounds: Normal breath sounds. No stridor, decreased air movement or  transmitted upper airway sounds. No decreased breath sounds, wheezing, rhonchi or rales.  ?Chest:  ?   Chest wall: No tenderness.  ?Musculoskeletal:     ?   General: Normal range of motion.  ?   Cervical bac

## 2021-11-05 NOTE — Discharge Instructions (Addendum)
To treat the inflammation in your nose and sinuses that is likely causing you to have an excess amount of clear fluid behind your eardrums, I recommend that you begin using a nasal steroid called Flonase.  I have sent a prescription to your pharmacy and provided you with a coupon just in case your insurance will not pay for it because it is available over-the-counter.   ? ?I recommend that you continue using Flonase nasal spray at least through the end of June.  The most common side effects of this medication is nosebleeds but we typically see this in patients who have used it for many years.  If this does occur, please discontinue this medication for 5 to 7 days then feel free to resume. ? ?Please spray 1 spray in each nare twice daily for the next 5 days then decrease to 1 spray in each nare once daily.  This is a topical steroid that does not get absorbed into the body, it is safe to use daily, some people use it year-round to prevent the aggravating side effects of environmental allergen exposure. ? ?For your nighttime symptoms which include sinus pressure, postnasal drip and difficulty falling asleep, I recommend 2 things. ? ?The first is to consider taking a nonsteroidal anti-inflammatory pain medication such as Aleve or Advil at the manufactures recommended dose, 1 tablet of Aleve or 2 tablets of Advil. ? ?The second is to consider adding a first generation antihistamine such as Benadryl or similar medication called doxylamine with your nonsteroidal anti-inflammatory pain medication, both about an hour before you plan to go to sleep.   ? ?The dose of Benadryl should be 12.5 mg (you are correct that the Advil PM medication contains Benadryl 25 mg which is a hefty dose and can make it difficult to wake up in the morning).   ? ?Doxylamine is found as a single ingredient in medications such as "Sleep Aid" or "Unisom", be sure you check the active ingredients before purchasing.  It is also a 25 mg tablet which I  recommend you break in half so that you can take 12.5 mg, the potency of this medication is equivalent to that of Benadryl. ? ?Benadryl is available in a liquid form in case you find breaking tablets in half tedious or difficult.  To my knowledge, doxylamine does not come in a liquid form but you can certainly check with your pharmacist to be sure. ? ?Please do not change your current routine for cleaning your ears, for most people a thorough cleaning is needed every couple of years, some more often than others.  What ever you are currently doing is sufficient. ? ?I hope this has been helpful for you.  Please let us know if it has not. ? ?Thank you for visiting urgent care today.  It was a pleasure meeting you and I appreciate the opportunity to participate in your care. ?

## 2021-11-14 ENCOUNTER — Encounter: Payer: Self-pay | Admitting: Internal Medicine

## 2021-11-14 ENCOUNTER — Ambulatory Visit (AMBULATORY_SURGERY_CENTER): Payer: Medicare Other | Admitting: Internal Medicine

## 2021-11-14 VITALS — BP 110/52 | HR 99 | Temp 96.0°F | Resp 12 | Ht 61.0 in | Wt 107.0 lb

## 2021-11-14 DIAGNOSIS — Z800 Family history of malignant neoplasm of digestive organs: Secondary | ICD-10-CM

## 2021-11-14 DIAGNOSIS — Z1211 Encounter for screening for malignant neoplasm of colon: Secondary | ICD-10-CM | POA: Diagnosis not present

## 2021-11-14 MED ORDER — SODIUM CHLORIDE 0.9 % IV SOLN: 500.0000 mL | Freq: Once | INTRAVENOUS | Status: DC

## 2021-11-14 NOTE — Patient Instructions (Signed)
Resume previous diet and medications. No repeat Colonoscopy needed. Follow up as needed with Dr. Marina Goodell ?YOU HAD AN ENDOSCOPIC PROCEDURE TODAY AT THE [facility]:   Refer to the procedure report that was given to you for any specific questions about what was found during the examination.  If the procedure report does not answer your questions, please call your gastroenterologist to clarify.  If you requested that your care partner not be given the details of your procedure findings, then the procedure report has been included in a sealed envelope for you to review at your convenience later. ? ?YOU SHOULD EXPECT: Some feelings of bloating in the abdomen. Passage of more gas than usual.  Walking can help get rid of the air that was put into your GI tract during the procedure and reduce the bloating. If you had a lower endoscopy (such as a colonoscopy or flexible sigmoidoscopy) you may notice spotting of blood in your stool or on the toilet paper. If you underwent a bowel prep for your procedure, you may not have a normal bowel movement for a few days. ? ?Please Note:  You might notice some irritation and congestion in your nose or some drainage.  This is from the oxygen used during your procedure.  There is no need for concern and it should clear up in a day or so. ? ?SYMPTOMS TO REPORT IMMEDIATELY: ? ?Following lower endoscopy (colonoscopy or flexible sigmoidoscopy): ? Excessive amounts of blood in the stool ? Significant tenderness or worsening of abdominal pains ? Swelling of the abdomen that is new, acute ? Fever of 100?F or higher ? ?For urgent or emergent issues, a gastroenterologist can be reached at any hour by calling [phone]. ?Do not use MyChart messaging for urgent concerns.  ? ? ?DIET:  We do recommend a small meal at first, but then you may proceed to your regular diet.  Drink plenty of fluids but you should avoid alcoholic beverages for 24 hours. ? ?ACTIVITY:  You should plan to take  it easy for the rest of today and you should NOT DRIVE or use heavy machinery until tomorrow (because of the sedation medicines used during the test).   ? ?FOLLOW UP: ?Our staff will call the number listed on your records 48-72 hours following your procedure to check on you and address any questions or concerns that you may have regarding the information given to you following your procedure. If we do not reach you, we will leave a message.  We will attempt to reach you two times.  During this call, we will ask if you have developed any symptoms of COVID 19. If you develop any symptoms (ie: fever, flu-like symptoms, shortness of breath, cough etc.) before then, please call [phone].  If you test positive for Covid 19 in the 2 weeks post procedure, please call and report this information to [place].   ? ?If any biopsies were taken you will be contacted by phone or by letter within the next 1-3 weeks.  Please call us at [phone] if you have not heard about the biopsies in 3 weeks.  ? ? ?SIGNATURES/CONFIDENTIALITY: ?You and/or your care partner have signed paperwork which will be entered into your electronic medical record.  These signatures attest to the fact that that the information above on your After Visit Summary has been reviewed and is understood.  Full responsibility of the confidentiality of this discharge information lies with you and/or your care-partner.  ?

## 2021-11-14 NOTE — Progress Notes (Signed)
HISTORY OF PRESENT ILLNESS: ? ?Julie Richard is a 76 y.o. female who presents today for colonoscopy due to a family history of rectal cancer in her sister.  Multiple prior colonoscopies elsewhere.  No active complaints ? ?REVIEW OF SYSTEMS: ? ?All non-GI ROS negative. ?Past Medical History:  ?Diagnosis Date  ? Chicken pox   ? Colon polyps   ? Hyperlipidemia   ? Osteoporosis   ? Temporal arteritis (HCC)   ? Thyroid disorder   ? ? ?Past Surgical History:  ?Procedure Laterality Date  ? ANKLE SURGERY    ? CATARACT EXTRACTION    ? TONSILLECTOMY AND ADENOIDECTOMY    ? TUBAL LIGATION    ? ? ?Social History ?Julie Richard  reports that she has quit smoking. She has never used smokeless tobacco. She reports current alcohol use. She reports that she does not use drugs. ? ?family history includes Alzheimer's disease in her mother; Cancer in her sister; Diabetes in her maternal grandfather and maternal grandmother; Early death in her father; Luiz Blare' disease in her sister; Healthy in her son; Hyperlipidemia in her brother; Leukemia in her son; Rectal cancer in her sister. ? ?Allergies  ?Allergen Reactions  ? Penicillins Rash  ?  As A Child  ? ? ?  ? ?PHYSICAL EXAMINATION: ? ?Vital signs: BP (!) 94/41   Pulse 74   Temp (!) 96 ?F (35.6 ?C)   Resp 19   Ht 5\' 1"  (1.549 m)   Wt 107 lb (48.5 kg)   SpO2 99%   BMI 20.22 kg/m?  ?General: Well-developed, well-nourished, no acute distress ?HEENT: Sclerae are anicteric, conjunctiva pink. Oral mucosa intact ?Lungs: Clear ?Heart: Regular ?Abdomen: soft, nontender, nondistended, no obvious ascites, no peritoneal signs, normal bowel sounds. No organomegaly. ?Extremities: No edema ?Psychiatric: alert and oriented x3. Cooperative  ? ? ? ?ASSESSMENT: ? ?Family history of colon cancer ? ? ?PLAN: ? ? ?High rescreening colonoscopy ? ? ? ?  ?

## 2021-11-14 NOTE — Op Note (Signed)
[facility] ?Patient Name: Julie AndreaJane Richard ?Procedure Date: 11/14/2021 9:13 AM ?MRN: [id number] ?Endoscopist: Wilhemina BonitoJohn N. Marina GoodellPerry , MD ?Age: [age] ?Referring MD:  ?Date of Birth: [birth year] ?Gender: Female ?Account #: [id number] ?Procedure:                Colonoscopy ?Indications:              Colon cancer screening in patient at increased  ?                          risk: Colorectal cancer in sister. Multiple prior  ?                          colonoscopies elsewhere. [place] examination 2017 [place]  ?                          [place] ?Medicines:                Monitored Anesthesia Care ?Procedure:                Pre-Anesthesia Assessment: ?                          - Prior to the procedure, a History and Physical  ?                          was performed, and patient medications and  ?                          allergies were reviewed. The patient's tolerance of  ?                          previous anesthesia was also reviewed. The risks  ?                          and benefits of the procedure and the sedation  ?                          options and risks were discussed with the patient.  ?                          All questions were answered, and informed consent  ?                          was obtained. Prior Anticoagulants: The patient has  ?                          taken no previous anticoagulant or antiplatelet  ?                          agents. ASA Grade Assessment: II - A patient with  ?                          mild systemic disease. After reviewing the risks  ?  and benefits, the patient was deemed in  ?                          satisfactory condition to undergo the procedure. ?                          After obtaining informed consent, the colonoscope  ?                          was passed under direct vision. Throughout the  ?                          procedure, the patient's blood pressure, pulse, and  ?                          oxygen saturations were monitored  continuously. The  ?                          Olympus PCF-H190DL (#[id number]) Colonoscope was  ?                          introduced through the anus and advanced to the the  ?                          cecum, identified by appendiceal orifice and  ?                          ileocecal valve. The ileocecal valve, appendiceal  ?                          orifice, and rectum were photographed. The quality  ?                          of the bowel preparation was excellent. The  ?                          colonoscopy was performed without difficulty. The  ?                          patient tolerated the procedure well. The bowel  ?                          preparation used was SUPREP via split dose  ?                          instruction. ?Scope In: 9:26:21 AM ?Scope Out: 9:40:34 AM ?Scope Withdrawal Time: 0 hours 9 minutes 29 seconds  ?Total Procedure Duration: 0 hours 14 minutes 13 seconds  ?Findings:                 External and internal hemorrhoids were found during  ?                          retroflexion. ?  The entire examined colon appeared normal on direct  ?                          and retroflexion views. ?Complications:            No immediate complications. Estimated blood loss:  ?                          None. ?Estimated Blood Loss:     Estimated blood loss: none. ?Impression:               - External and internal hemorrhoids. ?                          - The entire examined colon is normal on direct and  ?                          retroflexion views. ?                          - No specimens collected. ?Recommendation:           - Repeat colonoscopy is not recommended for  ?                          screening purposes. ?                          - Patient has a contact number available for  ?                          emergencies. The signs and symptoms of potential  ?                          delayed complications were discussed with the  ?                          patient. Return to  normal activities tomorrow.  ?                          Written discharge instructions were provided to the  ?                          patient. ?                          - Resume previous diet. ?                          - Continue present medications. ?Wilhemina Bonito. Marina Goodell, MD ?11/14/2021 9:54:52 AM ?This report has been signed electronically. ?

## 2021-11-14 NOTE — Progress Notes (Signed)
Report to PACU, RN, vss, BBS= Clear.  

## 2021-11-14 NOTE — Progress Notes (Signed)
Pt's states no medical or surgical changes since previsit or office visit. 

## 2021-11-14 NOTE — Progress Notes (Signed)
VS taken by C.W. 

## 2021-11-18 ENCOUNTER — Telehealth: Payer: Self-pay | Admitting: *Deleted

## 2021-11-18 NOTE — Telephone Encounter (Signed)
?  Follow up Call- ? ? ?  11/14/2021  ?  7:50 AM  ?Call back number  ?Post procedure Call Back phone  # [phone]  ?Permission to leave phone message Yes  ?  ? ?Patient questions: ? ?Do you have a fever, pain , or abdominal swelling? No. ?Pain Score  0 * ? ?Have you tolerated food without any problems? Yes.   ? ?Have you been able to return to your normal activities? Yes.   ? ?Do you have any questions about your discharge instructions: ?Diet   No. ?Medications  No. ?Follow up visit  No. ? ?Do you have questions or concerns about your Care? No. ? ?Actions: ?* If pain score is 4 or above: ?No action needed, pain <4. ? ? ?

## 2021-12-25 ENCOUNTER — Other Ambulatory Visit: Payer: Self-pay | Admitting: Internal Medicine

## 2021-12-25 DIAGNOSIS — Z1231 Encounter for screening mammogram for malignant neoplasm of breast: Secondary | ICD-10-CM

## 2022-01-04 ENCOUNTER — Other Ambulatory Visit: Payer: Self-pay | Admitting: Internal Medicine

## 2022-01-04 NOTE — Telephone Encounter (Addendum)
Last Prolia inj 11/04/21 Next Prolia inj due 05/08/22  [facility]?

## 2022-01-06 ENCOUNTER — Other Ambulatory Visit: Payer: Self-pay | Admitting: Internal Medicine

## 2022-02-07 ENCOUNTER — Other Ambulatory Visit: Payer: Self-pay | Admitting: Internal Medicine

## 2022-02-10 ENCOUNTER — Ambulatory Visit: Payer: Medicare Other

## 2022-03-25 ENCOUNTER — Ambulatory Visit
Admission: RE | Admit: 2022-03-25 | Discharge: 2022-03-25 | Disposition: A | Discharge status: Home / Self Care | Payer: Medicare Other | Source: Ambulatory Visit | Attending: Internal Medicine | Admitting: Internal Medicine

## 2022-03-25 DIAGNOSIS — Z1231 Encounter for screening mammogram for malignant neoplasm of breast: Secondary | ICD-10-CM

## 2022-04-24 NOTE — Telephone Encounter (Signed)
Patient ready to schedule- due 05/08/22. Medicare Supplement should cover all expenses after Medicare.  Patient has not had any insurance changes since last benefit check: Medicare/ BCBS Medicare Supplement. Plans are active.  Primary: Medicare Prolia co-insurance: 20% (approximately $276) Admin fee co-insurance: 20% (approximately $25)   Secondary: BCBS [place] Medicare Supp Prolia co-insurance:Covers Medicare Part B co-insurance  Admin fee co-insurance: Covers Medicare Part B co-insurance   Prior Auth: not required  Ref# [id number] Phone# [phone]  ** This summary of benefits is an estimation of the patient's out-of-pocket cost. Exact cost may vary based on individual plan coverage.

## 2022-04-24 NOTE — Telephone Encounter (Signed)
Called pt she inform me that her MD that she has now made arrangements for her to get. She is in the process of moving husband pass away.Marland KitchenJohny Richard

## 2022-05-08 ENCOUNTER — Encounter (HOSPITAL_COMMUNITY): Payer: Medicare Other

## 2022-05-09 ENCOUNTER — Other Ambulatory Visit (HOSPITAL_COMMUNITY): Payer: Self-pay | Admitting: *Deleted

## 2022-05-12 ENCOUNTER — Ambulatory Visit (HOSPITAL_COMMUNITY)
Admission: RE | Admit: 2022-05-12 | Discharge: 2022-05-12 | Disposition: A | Discharge status: Home / Self Care | Payer: Medicare Other | Source: Ambulatory Visit | Attending: Internal Medicine | Admitting: Internal Medicine

## 2022-05-12 DIAGNOSIS — M810 Age-related osteoporosis without current pathological fracture: Secondary | ICD-10-CM | POA: Insufficient documentation

## 2022-05-12 MED ORDER — DENOSUMAB 60 MG/ML ~~LOC~~ SOSY
60.0000 mg | PREFILLED_SYRINGE | Freq: Once | SUBCUTANEOUS | Status: AC
  Administered 2022-05-12: 60 mg via SUBCUTANEOUS

## 2022-05-12 MED ORDER — DENOSUMAB 60 MG/ML ~~LOC~~ SOSY
PREFILLED_SYRINGE | SUBCUTANEOUS | Status: AC
  Filled 2022-05-12: qty 1

## 2022-10-03 ENCOUNTER — Encounter: Payer: Self-pay | Admitting: Emergency Medicine

## 2022-10-03 ENCOUNTER — Ambulatory Visit
Admission: EM | Admit: 2022-10-03 | Discharge: 2022-10-03 | Disposition: A | Discharge status: Home / Self Care | Payer: Medicare Other | Attending: Urgent Care | Admitting: Urgent Care

## 2022-10-03 DIAGNOSIS — M2550 Pain in unspecified joint: Secondary | ICD-10-CM

## 2022-10-03 DIAGNOSIS — M316 Other giant cell arteritis: Secondary | ICD-10-CM | POA: Diagnosis not present

## 2022-10-03 DIAGNOSIS — M25551 Pain in right hip: Secondary | ICD-10-CM

## 2022-10-03 DIAGNOSIS — G44209 Tension-type headache, unspecified, not intractable: Secondary | ICD-10-CM

## 2022-10-03 DIAGNOSIS — M25552 Pain in left hip: Secondary | ICD-10-CM | POA: Diagnosis not present

## 2022-10-03 DIAGNOSIS — M5030 Other cervical disc degeneration, unspecified cervical region: Secondary | ICD-10-CM

## 2022-10-03 MED ORDER — PREDNISONE 20 MG PO TABS: ORAL_TABLET | ORAL | 0 refills | Status: AC

## 2022-10-03 NOTE — Discharge Instructions (Addendum)
Please make sure you follow-up with the rheumatologist as soon as possible.  You can start the prednisone course for what I suspect is an arthritis flare, degenerative disc disease flare.  Will let you know about your blood test results next week.

## 2022-10-03 NOTE — ED Provider Notes (Signed)
[facility]  Note:  This document was prepared using Systems analyst and may include unintentional dictation errors.  MRN: [id number] DOB: Apr 05, 1946  Subjective:   Julie Richard is a 77 y.o. female presenting for several week history of recurring and persistent neck pain, right-sided headache, neck stiffness.  Has also started to have intermittent bilateral hip pain.  Patient would like to have blood work done.  Has a history of temporal arteritis.  She underwent a course of prednisone for more than a year before she went into remission.  Has not had this for more than a year.  Patient also has a history of arthritis, osteoporosis.  Chart review shows that she has degenerative disc disease at the cervical level.  No fall, trauma, weakness, numbness or tingling.  No confusion, double vision.  No swelling along the temporal sides of her head.  No current facility-administered medications for this encounter.  Current Outpatient Medications:    acetaminophen (TYLENOL) 650 MG CR tablet, Take 650 mg by mouth as needed for pain., Disp: , Rfl:    Aspirin 81 MG CAPS, 1 capsule, Disp: , Rfl:    aspirin 81 MG chewable tablet, Chew 81 mg by mouth every other day. (Patient not taking: Reported on 11/14/2021), Disp: , Rfl:    CALCIUM PO, Take by mouth., Disp: , Rfl:    Cholecalciferol 50 MCG (2000 UT) TABS, 1000 iu 3/week - high levels in the past (Patient taking differently: Take 1,000 Units by mouth every other day.), Disp: 100 tablet, Rfl: 3   denosumab (PROLIA) 60 MG/ML SOSY injection, Inject 60 mg into the skin every 6 (six) months., Disp: , Rfl:    fluticasone (FLONASE) 50 MCG/ACT nasal spray, Place 1 spray into both nostrils daily. Begin by using 2 sprays in each nare daily for 3 to 5 days, then decrease to 1 spray in each nare daily., Disp: 16 mL, Rfl: 1   ibuprofen (ADVIL) 800 MG tablet, Take by mouth., Disp: , Rfl:    Multiple Vitamin (MULTI-VITAMIN)  tablet, Take by mouth., Disp: , Rfl:    oxyCODONE-acetaminophen (PERCOCET/ROXICET) 5-325 MG tablet, Take 1 tablet by mouth every 8 (eight) hours as needed for up to 5 doses for severe pain., Disp: 5 tablet, Rfl: 0   PEG-KCl-NaCl-NaSulf-Na Asc-C (PLENVU) 140 g SOLR, Take 1 kit by mouth as directed., Disp: 1 each, Rfl: 0   rosuvastatin (CRESTOR) 10 MG tablet, TAKE 1 TABLET(10 MG) BY MOUTH DAILY Keep follow-up appt for future refills, Disp: 90 tablet, Rfl: 0   tolterodine (DETROL LA) 4 MG 24 hr capsule, Take 1 capsule (4 mg total) by mouth daily. Annual appt is due must see provider for future refills, Disp: 30 capsule, Rfl: 0   Allergies  Allergen Reactions   Penicillins Rash    As A Child    Past Medical History:  Diagnosis Date   Chicken pox    Colon polyps    Hyperlipidemia    Osteoporosis    Temporal arteritis ([place])    Thyroid disorder      Past Surgical History:  Procedure Laterality Date   ANKLE SURGERY     CATARACT EXTRACTION     TONSILLECTOMY AND ADENOIDECTOMY     TUBAL LIGATION      Family History  Problem Relation Age of Onset   Alzheimer's disease Mother    Early death Father    Rectal cancer Sister    Cancer Sister  Rectal cancer   Graves' disease Sister    Hyperlipidemia Brother    Diabetes Maternal Grandmother    Diabetes Maternal Grandfather    Healthy Son    Leukemia Son    Colon cancer Neg Hx    Colon polyps Neg Hx    Esophageal cancer Neg Hx    Stomach cancer Neg Hx     Social History   Tobacco Use   Smoking status: Former   Smokeless tobacco: Never  Scientific laboratory technician Use: Never used  Substance Use Topics   Alcohol use: Yes    Comment: rarely   Drug use: Never    ROS   Objective:   Vitals: BP (!) 152/74 (BP Location: Right Arm)   Pulse 83   Temp 98.1 F (36.7 C) (Oral)   Resp 18   SpO2 98%   Physical Exam Constitutional:      General: She is not in acute distress.    Appearance: Normal appearance. She is  well-developed and normal weight. She is not ill-appearing, toxic-appearing or diaphoretic.  HENT:     Head: Normocephalic and atraumatic.     Right Ear: Tympanic membrane, ear canal and external ear normal. No drainage or tenderness. No middle ear effusion. There is no impacted cerumen. Tympanic membrane is not erythematous or bulging.     Left Ear: Tympanic membrane, ear canal and external ear normal. No drainage or tenderness.  No middle ear effusion. There is no impacted cerumen. Tympanic membrane is not erythematous or bulging.     Nose: Nose normal. No congestion or rhinorrhea.     Mouth/Throat:     Mouth: Mucous membranes are moist. No oral lesions.     Pharynx: No pharyngeal swelling, oropharyngeal exudate, posterior oropharyngeal erythema or uvula swelling.     Tonsils: No tonsillar exudate or tonsillar abscesses.  Eyes:     General: No scleral icterus.       Right eye: No discharge.        Left eye: No discharge.     Extraocular Movements: Extraocular movements intact.     Right eye: Normal extraocular motion.     Left eye: Normal extraocular motion.     Conjunctiva/sclera: Conjunctivae normal.     Pupils: Pupils are equal, round, and reactive to light.  Neck:     Meningeal: Brudzinski's sign and Kernig's sign absent.  Cardiovascular:     Rate and Rhythm: Normal rate.  Pulmonary:     Effort: Pulmonary effort is normal.  Musculoskeletal:     Cervical back: Neck supple. Spasms and tenderness present. No swelling, edema, deformity, erythema, signs of trauma, lacerations, rigidity, torticollis, bony tenderness or crepitus. Pain with movement present. Decreased range of motion.  Lymphadenopathy:     Cervical: No cervical adenopathy.  Skin:    General: Skin is warm and dry.  Neurological:     General: No focal deficit present.     Mental Status: She is alert and oriented to person, place, and time.     Cranial Nerves: No cranial nerve deficit, dysarthria or facial asymmetry.      Motor: No weakness.     Coordination: Romberg sign negative. Coordination normal.     Gait: Gait normal.     Deep Tendon Reflexes: Reflexes normal.  Psychiatric:        Mood and Affect: Mood normal.        Behavior: Behavior normal.     Assessment and Plan :   PDMP not  reviewed this encounter.  1. Tension headache   2. Temporal arteritis ([place])   3. Bilateral hip pain   4. Multiple joint pain   5. Degenerative disc disease, cervical     Labs pending.  I do not suspect an acute encephalopathy.  Recommended an oral prednisone course given her multiple joint pains, degenerative disc disease, persistent neck pain.  She is to follow-up with her rheumatologist. Counseled patient on potential for adverse effects with medications prescribed/recommended today, ER and return-to-clinic precautions discussed, patient verbalized understanding.    Jaynee Eagles, [place] 10/03/22 1257

## 2022-10-03 NOTE — ED Triage Notes (Signed)
Pt reports an intermittent headache on the right side, neck stiffness, and bilateral hip soreness for several weeks.  States she was dx with Temporal Arteritis several years ago. Requesting blood work to check the sed rate.

## 2022-10-04 LAB — C-REACTIVE PROTEIN: CRP: 3 mg/L (ref 0–10)

## 2022-10-04 LAB — SEDIMENTATION RATE: Sed Rate: 9 mm/hr (ref 0–40)

## 2022-11-21 ENCOUNTER — Other Ambulatory Visit (HOSPITAL_COMMUNITY): Payer: Self-pay | Admitting: *Deleted

## 2022-11-24 ENCOUNTER — Ambulatory Visit (HOSPITAL_COMMUNITY)
Admission: RE | Admit: 2022-11-24 | Discharge: 2022-11-24 | Disposition: A | Discharge status: Home / Self Care | Payer: Medicare Other | Source: Ambulatory Visit | Attending: Internal Medicine | Admitting: Internal Medicine

## 2022-11-24 DIAGNOSIS — M810 Age-related osteoporosis without current pathological fracture: Secondary | ICD-10-CM | POA: Diagnosis present

## 2022-11-24 MED ORDER — DENOSUMAB 60 MG/ML ~~LOC~~ SOSY
PREFILLED_SYRINGE | SUBCUTANEOUS | Status: AC
  Filled 2022-11-24: qty 1

## 2022-11-24 MED ORDER — DENOSUMAB 60 MG/ML ~~LOC~~ SOSY
60.0000 mg | PREFILLED_SYRINGE | Freq: Once | SUBCUTANEOUS | Status: AC
  Administered 2022-11-24: 60 mg via SUBCUTANEOUS

## 2023-02-10 ENCOUNTER — Other Ambulatory Visit: Payer: Self-pay | Admitting: Internal Medicine

## 2023-02-10 DIAGNOSIS — Z1231 Encounter for screening mammogram for malignant neoplasm of breast: Secondary | ICD-10-CM

## 2023-03-30 ENCOUNTER — Ambulatory Visit: Payer: Medicare Other

## 2023-04-16 ENCOUNTER — Ambulatory Visit
Admission: RE | Admit: 2023-04-16 | Discharge: 2023-04-16 | Disposition: A | Discharge status: Home / Self Care | Payer: Medicare Other | Source: Ambulatory Visit | Attending: Internal Medicine | Admitting: Internal Medicine

## 2023-04-16 DIAGNOSIS — Z1231 Encounter for screening mammogram for malignant neoplasm of breast: Secondary | ICD-10-CM

## 2024-03-02 ENCOUNTER — Other Ambulatory Visit: Payer: Self-pay | Admitting: Internal Medicine

## 2024-03-02 DIAGNOSIS — Z1231 Encounter for screening mammogram for malignant neoplasm of breast: Secondary | ICD-10-CM

## 2024-04-20 ENCOUNTER — Ambulatory Visit
Admission: RE | Admit: 2024-04-20 | Discharge: 2024-04-20 | Disposition: A | Discharge status: Home / Self Care | Source: Ambulatory Visit | Attending: Internal Medicine | Admitting: Internal Medicine

## 2024-04-20 DIAGNOSIS — Z1231 Encounter for screening mammogram for malignant neoplasm of breast: Secondary | ICD-10-CM

## 2024-05-30 ENCOUNTER — Other Ambulatory Visit (HOSPITAL_BASED_OUTPATIENT_CLINIC_OR_DEPARTMENT_OTHER): Payer: Self-pay

## 2024-05-30 MED ORDER — AREXVY 120 MCG/0.5ML IM SUSR
0.5000 mL | Freq: Once | INTRAMUSCULAR | 0 refills | Status: AC
  Filled 2024-05-30: qty 0.5, 1d supply, fill #0

## 2024-07-06 ENCOUNTER — Other Ambulatory Visit (HOSPITAL_BASED_OUTPATIENT_CLINIC_OR_DEPARTMENT_OTHER): Payer: Self-pay

## 2024-07-06 MED ORDER — COMIRNATY 30 MCG/0.3ML IM SUSY
0.3000 mL | PREFILLED_SYRINGE | Freq: Once | INTRAMUSCULAR | 0 refills | Status: AC
  Filled 2024-07-06: qty 0.3, 1d supply, fill #0
